# Patient Record
Sex: Female | Born: 1940 | Race: White | Hispanic: No | Marital: Married | Smoking: Never smoker
Health system: Southern US, Community
[De-identification: ages and names within clinical notes are randomized; demographics above are authoritative.]

## PROBLEM LIST (undated history)

## (undated) DIAGNOSIS — E039 Hypothyroidism, unspecified: Secondary | ICD-10-CM

## (undated) DIAGNOSIS — F419 Anxiety disorder, unspecified: Secondary | ICD-10-CM

## (undated) DIAGNOSIS — E785 Hyperlipidemia, unspecified: Secondary | ICD-10-CM

## (undated) DIAGNOSIS — G4730 Sleep apnea, unspecified: Secondary | ICD-10-CM

## (undated) DIAGNOSIS — I10 Essential (primary) hypertension: Secondary | ICD-10-CM

## (undated) DIAGNOSIS — K2970 Gastritis, unspecified, without bleeding: Secondary | ICD-10-CM

## (undated) DIAGNOSIS — H02402 Unspecified ptosis of left eyelid: Secondary | ICD-10-CM

## (undated) DIAGNOSIS — E119 Type 2 diabetes mellitus without complications: Secondary | ICD-10-CM

## (undated) DIAGNOSIS — M1990 Unspecified osteoarthritis, unspecified site: Secondary | ICD-10-CM

## (undated) DIAGNOSIS — G4733 Obstructive sleep apnea (adult) (pediatric): Secondary | ICD-10-CM

## (undated) DIAGNOSIS — E079 Disorder of thyroid, unspecified: Secondary | ICD-10-CM

## (undated) DIAGNOSIS — K219 Gastro-esophageal reflux disease without esophagitis: Secondary | ICD-10-CM

## (undated) HISTORY — PX: APPENDECTOMY: SHX54

## (undated) HISTORY — DX: Hyperlipidemia, unspecified: E78.5

## (undated) HISTORY — DX: Unspecified ptosis of left eyelid: H02.402

## (undated) HISTORY — DX: Anxiety disorder, unspecified: F41.9

## (undated) HISTORY — DX: Type 2 diabetes mellitus without complications: E11.9

## (undated) HISTORY — DX: Essential (primary) hypertension: I10

## (undated) HISTORY — DX: Hypothyroidism, unspecified: E03.9

## (undated) HISTORY — DX: Gastritis, unspecified, without bleeding: K29.70

## (undated) HISTORY — DX: Unspecified osteoarthritis, unspecified site: M19.90

## (undated) HISTORY — PX: ABDOMINAL HYSTERECTOMY: SHX81

## (undated) HISTORY — PX: CHOLECYSTECTOMY: SHX55

## (undated) HISTORY — DX: Obstructive sleep apnea (adult) (pediatric): G47.33

## (undated) HISTORY — DX: Gastro-esophageal reflux disease without esophagitis: K21.9

## (undated) HISTORY — PX: DENTAL RESTORATION/EXTRACTION WITH X-RAY: SHX5796

## (undated) HISTORY — DX: Disorder of thyroid, unspecified: E07.9

## (undated) HISTORY — PX: OTHER SURGICAL HISTORY: SHX169

## (undated) HISTORY — DX: Sleep apnea, unspecified: G47.30

## (undated) HISTORY — PX: UVULECTOMY: SHX2631

---

## 1998-06-15 ENCOUNTER — Ambulatory Visit (HOSPITAL_COMMUNITY): Admission: RE | Admit: 1998-06-15 | Discharge: 1998-06-15 | Payer: Self-pay | Admitting: Family Medicine

## 1998-06-16 ENCOUNTER — Observation Stay (HOSPITAL_COMMUNITY): Admission: RE | Admit: 1998-06-16 | Discharge: 1998-06-17 | Payer: Self-pay | Admitting: *Deleted

## 2000-12-16 ENCOUNTER — Ambulatory Visit (HOSPITAL_BASED_OUTPATIENT_CLINIC_OR_DEPARTMENT_OTHER): Admission: RE | Admit: 2000-12-16 | Discharge: 2000-12-16 | Payer: Self-pay | Admitting: Internal Medicine

## 2001-01-29 ENCOUNTER — Other Ambulatory Visit: Admission: RE | Admit: 2001-01-29 | Discharge: 2001-01-29 | Payer: Self-pay | Admitting: Internal Medicine

## 2001-01-29 ENCOUNTER — Encounter (INDEPENDENT_AMBULATORY_CARE_PROVIDER_SITE_OTHER): Payer: Self-pay | Admitting: Specialist

## 2001-06-25 ENCOUNTER — Ambulatory Visit (HOSPITAL_BASED_OUTPATIENT_CLINIC_OR_DEPARTMENT_OTHER): Admission: RE | Admit: 2001-06-25 | Discharge: 2001-06-26 | Payer: Self-pay | Admitting: Otolaryngology

## 2001-06-25 ENCOUNTER — Encounter (INDEPENDENT_AMBULATORY_CARE_PROVIDER_SITE_OTHER): Payer: Self-pay | Admitting: *Deleted

## 2002-02-24 ENCOUNTER — Encounter: Admission: RE | Admit: 2002-02-24 | Discharge: 2002-03-16 | Payer: Self-pay | Admitting: Family Medicine

## 2002-03-11 ENCOUNTER — Encounter: Payer: Self-pay | Admitting: Internal Medicine

## 2003-03-10 ENCOUNTER — Encounter: Payer: Self-pay | Admitting: Internal Medicine

## 2003-10-29 ENCOUNTER — Encounter: Admission: RE | Admit: 2003-10-29 | Discharge: 2003-10-29 | Payer: Self-pay | Admitting: Orthopedic Surgery

## 2004-05-18 ENCOUNTER — Encounter: Admission: RE | Admit: 2004-05-18 | Discharge: 2004-05-18 | Payer: Self-pay | Admitting: Family Medicine

## 2004-06-27 ENCOUNTER — Encounter: Payer: Self-pay | Admitting: Internal Medicine

## 2008-12-03 ENCOUNTER — Emergency Department (HOSPITAL_COMMUNITY): Admission: EM | Admit: 2008-12-03 | Discharge: 2008-12-03 | Payer: Self-pay | Admitting: Emergency Medicine

## 2009-11-13 ENCOUNTER — Encounter (INDEPENDENT_AMBULATORY_CARE_PROVIDER_SITE_OTHER): Payer: Self-pay | Admitting: *Deleted

## 2009-12-11 ENCOUNTER — Encounter (INDEPENDENT_AMBULATORY_CARE_PROVIDER_SITE_OTHER): Payer: Self-pay | Admitting: *Deleted

## 2009-12-18 ENCOUNTER — Ambulatory Visit: Payer: Self-pay | Admitting: Internal Medicine

## 2009-12-26 ENCOUNTER — Ambulatory Visit: Payer: Self-pay | Admitting: Internal Medicine

## 2010-06-27 ENCOUNTER — Encounter: Admission: RE | Admit: 2010-06-27 | Discharge: 2010-08-30 | Payer: Self-pay | Admitting: Family Medicine

## 2010-12-26 ENCOUNTER — Telehealth: Payer: Self-pay | Admitting: Internal Medicine

## 2010-12-26 ENCOUNTER — Encounter (INDEPENDENT_AMBULATORY_CARE_PROVIDER_SITE_OTHER): Payer: Self-pay | Admitting: *Deleted

## 2011-01-01 NOTE — Letter (Signed)
Summary: [facility] Instructions  [facility]  [address] [place], [place] [address]   Phone: [phone]  Fax: [phone]       Bethany Harrington    05-10-1941    MRN: [id number]       Procedure Day [name]:  [name]  12/26/09     Arrival Time: 9:00AM     Procedure Time:  10:00AM     Location of Procedure:                    [name] _  [facility] (4th Floor)    PREPARATION FOR COLONOSCOPY WITH MIRALAX  Starting 5 days prior to your procedure 12/21/09 do not eat nuts, seeds, popcorn, corn, beans, peas,  salads, or any raw vegetables.  Do not take any fiber supplements (e.g. Metamucil, Citrucel, and Benefiber). ____________________________________________________________________________________________________   THE DAY BEFORE YOUR PROCEDURE         DATE: 12/25/09 DAY: MONDAY  1   Drink clear liquids the entire day-NO SOLID FOOD  2   Do not drink anything colored red or purple.  Avoid juices with pulp.  No orange juice.  3   Drink at least 64 oz. (8 glasses) of fluid/clear liquids during the day to prevent dehydration and help the prep work efficiently.  CLEAR LIQUIDS INCLUDE: Water Jello Ice Popsicles Tea (sugar ok, no milk/cream) Powdered fruit flavored drinks Coffee (sugar ok, no milk/cream) Gatorade Juice: apple, white grape, white cranberry  Lemonade Clear bullion, consomm, broth Carbonated beverages (any kind) Strained chicken noodle soup Hard Candy  4   Mix the entire bottle of Miralax with 64 oz. of Gatorade/Powerade in the morning and put in the refrigerator to chill.  5   At 3:00 pm take 2 Dulcolax/Bisacodyl tablets.  6   At 4:30 pm take one Reglan/Metoclopramide tablet.  7  Starting at 5:00 pm drink one 8 oz glass of the Miralax mixture every 15-20 minutes until you have finished drinking the entire 64 oz.  You should finish drinking prep around 7:30 or 8:00 pm.  8   If you are nauseated, you may take the 2nd  Reglan/Metoclopramide tablet at 6:30 pm.        9    At 8:00 pm take 2 more DULCOLAX/Bisacodyl tablets.     THE DAY OF YOUR PROCEDURE      DATE:  12/26/09 DAY: [name]  You may drink clear liquids until 8:00AM  (2 HOURS BEFORE PROCEDURE).   MEDICATION INSTRUCTIONS  Unless otherwise instructed, you should take regular prescription medications with a small sip of water as early as possible the morning of your procedure.        OTHER INSTRUCTIONS  You will need a responsible adult at least 71 years of age to accompany you and drive you home.   This person must remain in the waiting room during your procedure.  Wear loose fitting clothing that is easily removed.  Leave jewelry and other valuables at home.  However, you may wish to bring a book to read or an iPod/MP3 player to listen to music as you wait for your procedure to start.  Remove all body piercing jewelry and leave at home.  Total time from sign-in until discharge is approximately 2-3 hours.  You should go home directly after your procedure and rest.  You can resume normal activities the day after your procedure.  The day of your procedure you should not:   Drive   Make legal  decisions   Operate machinery   Drink alcohol   Return to work  You will receive specific instructions about eating, activities and medications before you leave.   The above instructions have been reviewed and explained to me by   Wyona Almas RN  December 18, 2069 9:35 AM     I fully understand and can verbalize these instructions _____________________________ Date _______

## 2011-01-01 NOTE — Miscellaneous (Signed)
Summary: [facility] Previsit/prep  Clinical Lists Changes  Medications: Added new medication of MIRALAX   POWD (POLYETHYLENE GLYCOL 3350) As per prep  instructions. - Signed Added new medication of METOCLOPRAMIDE HCL 10 MG  TABS (METOCLOPRAMIDE HCL) As per prep instructions. - Signed Added new medication of DULCOLAX 5 MG  TBEC (BISACODYL) Day before procedure take 2 at 3pm and 2 at 8pm. - Signed Rx of MIRALAX   POWD (POLYETHYLENE GLYCOL 3350) As per prep  instructions.;  #255gm x 0;  Signed;  Entered by: Wyona Almas RN;  Authorized by: Hart Carwin MD;  Method used: Electronically to [facility] [phone] [place], [place], [place]  [address], Ph: [phone], Fax: [phone] Rx of METOCLOPRAMIDE HCL 10 MG  TABS (METOCLOPRAMIDE HCL) As per prep instructions.;  #2 x 0;  Signed;  Entered by: Wyona Almas RN;  Authorized by: Hart Carwin MD;  Method used: Electronically to [facility] [phone] [place], [place], [place]  [address], Ph: [phone], Fax: [phone] Rx of DULCOLAX 5 MG  TBEC (BISACODYL) Day before procedure take 2 at 3pm and 2 at 8pm.;  #4 x 0;  Signed;  Entered by: Wyona Almas RN;  Authorized by: Hart Carwin MD;  Method used: Electronically to [facility] [phone] [place], [place], [place]  [address], Ph: [phone], Fax: [phone] Allergies: Added new allergy or adverse reaction of SULFA Added new allergy or adverse reaction of NEOSPORIN Observations: Added new observation of NKA: F (12/18/2009 8:57)    Prescriptions: DULCOLAX 5 MG  TBEC (BISACODYL) Day before procedure take 2 at 3pm and 2 at 8pm.  #4 x 0   Entered by:   Wyona Almas RN   Authorized by:   Hart Carwin MD   Signed by:   Wyona Almas RN on 12/18/2009   Method used:   Electronically to        [facility] [address] (retail)       [phone]       [place], [place]  [address]       Ph: [phone]       Fax: [phone]   RxID:   [id number] METOCLOPRAMIDE HCL 10 MG  TABS (METOCLOPRAMIDE HCL) As per prep instructions.  #2 x 0   Entered by:   Wyona Almas RN   Authorized by:   Hart Carwin MD   Signed by:   Wyona Almas RN on 12/18/2009   Method used:   Electronically to        [facility] [address] (retail)       [phone]       [place], [place]  [address]       Ph: [phone]       Fax: [phone]   RxID:   [id number] MIRALAX   POWD (POLYETHYLENE GLYCOL 3350) As per prep  instructions.  #255gm x 0   Entered by:   Wyona Almas RN   Authorized by:   Hart Carwin MD   Signed by:   Wyona Almas RN on 12/18/2009   Method used:   Electronically to        [facility] [address] (retail)       [phone]       [place], [place]  [address]  Ph: [phone]       Fax: [phone]   RxID:   [id number]

## 2011-01-01 NOTE — Procedures (Signed)
Summary: Colonoscopy  Patient: [name] Note: All result statuses are Final unless otherwise noted.  Tests: (1) Colonoscopy (COL)   COL Colonoscopy           DONE     [facility]     [address]     [place], [place]  [address]           COLONOSCOPY PROCEDURE REPORT           PATIENT:  Bethany Harrington, Bethany Harrington  MR#:  [id number]     BIRTHDATE:  12-11-1940, 68 yrs. old  GENDER:  female           ENDOSCOPIST:  Hedwig Morton. Juanda Chance, MD     Referred by:  Laruth Bouchard, M.D.           PROCEDURE DATE:  12/26/2009     PROCEDURE:  Colonoscopy with multiple cold biopsies, Colonoscopy     [phone]     ASA CLASS:  Class I     INDICATIONS:  colon cncer mother and brother     aden. polyp [id number]           MEDICATIONS:   Versed 7 mg, Fentanyl 75 mcg           DESCRIPTION OF PROCEDURE:   After the risks benefits and     alternatives of the procedure were thoroughly explained, informed     consent was obtained.  Digital rectal exam was performed and     revealed no rectal masses.   The LB [id number] endoscope     was introduced through the anus and advanced to the cecum, which     was identified by both the appendix and ileocecal valve, without     limitations.  The quality of the prep was adequate, using MiraLax.     The instrument was then slowly withdrawn as the colon was fully     examined.     <<PROCEDUREIMAGES>>           FINDINGS:  No polyps or cancers were seen (see image1, image2,     image3, image4, and image5).   Retroflexed views in the rectum     revealed no abnormalities.    The scope was then withdrawn from     the patient and the procedure completed.           COMPLICATIONS:  None           ENDOSCOPIC IMPRESSION:     1) No polyps or cancers     2) Normal colonoscopy     RECOMMENDATIONS:     1) high fiber diet           REPEAT EXAM:  In 5 year(s) for.           ______________________________     Hedwig Morton. Juanda Chance, MD           CC:           n.  eSIGNED:   Hedwig Morton. Brodie at 12/26/2009 11:04 AM           Tami Lin, [id number]  Note: An exclamation mark (!) indicates a result that was not dispersed into the flowsheet. Document Creation Date: 12/26/2009 11:05 AM _______________________________________________________________________  (1) Order result status: Final Collection or observation date-time: 12/26/2009 10:55 Requested date-time:  Receipt date-time:  Reported date-time:  Referring Physician:   Ordering Physician: Lina Sar [phone]) Specimen Source:  Source: Launa Grill Order Number: [id number] Lab site:

## 2011-01-03 NOTE — Progress Notes (Signed)
Summary: need rx refill  Phone Note Call from Patient Call back at Home Phone [phone]   Caller: Patient Call For: Dr Juanda Chance Reason for Call: Refill Medication, Talk to Nurse Summary of Call: Patient would like refill for her Nexium until her appt date 3-7 Initial call taken by: Tawni Levy,  December 26, 2010 12:23 PM  Follow-up for Phone Call        Patient states that Dr Juanda Chance was her GI Dr before she moved to [place]. She states that she had a large supply of Nexium but has since ran out. She states that she went to an urgent care to get the prescription renewed but they would not give her a prescription becuase she has been on the prescription for so long. Patient has an appointment on 02/06/11. She states that she has been out of her medication x 1 week and has had epigastric abdominal burning as well as midsternal chest discomfort. She denies any nausea, vomiting or other symptoms. States OTC medications are not working. Dr Juanda Chance, would you like to wait to see patient in office before giving her meds or would you like me to send an prescription until she can be seen in the office? Follow-up by: Lamona Curl CMA Duncan Dull),  December 26, 2010 12:31 PM  Additional Follow-up for Phone Call Additional follow up Details #1::        OK to refill.  till she sees me. Additional Follow-up by: Hart Carwin MD,  December 26, 2010 7:29 PM    Additional Follow-up for Phone Call Additional follow up Details #2::    I have left a message for the patient to call back. Dottie Nelson-Smith CMA (AAMA)  December 27, 2010 8:16 AM   I have left a message for the patient to call back. Dottie Nelson-Smith CMA Duncan Dull)  December 28, 2010 8:26 AM   Patient called back. I have advised her that Dr Juanda Chance is okay with me giving her refills of Nexium until she can be seen in the office. Patient now states, my insurance will not cover that. I have asked that she call her insurance company to find out what  kind of PPI they prefer and to call us back so we can send it in. Patient verbalizes understanding. Follow-up by: Lamona Curl CMA Duncan Dull),  December 28, 2010 8:36 AM   Appended Document: need rx refill Patient called back. She states her insurance will cover pantoprazole. Prescription sent to [facility] per patient request. Lamona Curl CMA (AAMA)  December 28, 2010 9:30 AM   Clinical Lists Changes  Medications: Added new medication of PANTOPRAZOLE SODIUM 40 MG TBEC (PANTOPRAZOLE SODIUM) Take 1 tablet by mouth 30 minutes before breakfast daily. - Signed Rx of PANTOPRAZOLE SODIUM 40 MG TBEC (PANTOPRAZOLE SODIUM) Take 1 tablet by mouth 30 minutes before breakfast daily.;  #30 x 1;  Signed;  Entered by: Lamona Curl CMA (AAMA);  Authorized by: Hart Carwin MD;  Method used: Electronically to [facility] [phone] [place], [place], [place]  [address], Ph: [phone], Fax: [phone]    Prescriptions: PANTOPRAZOLE SODIUM 40 MG TBEC (PANTOPRAZOLE SODIUM) Take 1 tablet by mouth 30 minutes before breakfast daily.  #30 x 1   Entered by:   Lamona Curl CMA (AAMA)   Authorized by:   Hart Carwin MD   Signed by:   Lamona Curl CMA (AAMA) on 12/28/2010   Method used:   Electronically to  [facility] [address] (retail)       [phone]       [place], [place]  [address]       Ph: [phone]       Fax: [phone]   RxID:   [id number]

## 2011-01-03 NOTE — Letter (Signed)
Summary: New Patient letter  [facility]  [address] [place], [place] [address]   Phone: [phone]  Fax: [phone]       12/26/2010 MRN: [id number]  [facility] [address] [place], [place]  [address]  Dear Ms. Siems,  Welcome to the Gastroenterology Division at [facility]    You are scheduled to see Dr.  Lina Sar on February 06, 2011 at 8:45am on the 3rd floor at [facility], [address]  We ask that you try to arrive at our office 15 minutes prior to your appointment time to allow for check-in.  We would like you to complete the enclosed self-administered evaluation form prior to your visit and bring it with you on the day of your appointment.  We will review it with you.  Also, please bring a complete list of all your medications or, if you prefer, bring the medication bottles and we will list them.  Please bring your insurance card so that we may make a copy of it.  If your insurance requires a referral to see a specialist, please bring your referral form from your primary care physician.  Co-payments are due at the time of your visit and may be paid by cash, check or credit card.     Your office visit will consist of a consult with your physician (includes a physical exam), any laboratory testing he/she may order, scheduling of any necessary diagnostic testing (e.g. x-ray, ultrasound, CT-scan), and scheduling of a procedure (e.g. Endoscopy, Colonoscopy) if required.  Please allow enough time on your schedule to allow for any/all of these possibilities.    If you cannot keep your appointment, please call [phone] to cancel or reschedule prior to your appointment date.  This allows [place] the opportunity to schedule an appointment for another patient in need of care.  If you do not cancel or reschedule by 5 p.m. the business day prior to your appointment date, you will be charged a $50.00 late cancellation/no-show fee.    Thank you  for choosing [facility] for your medical needs.  We appreciate the opportunity to care for you.  Please visit [place] at our website  to learn more about our practice.                     Sincerely,                                                             The Gastroenterology Division

## 2011-01-31 DIAGNOSIS — E785 Hyperlipidemia, unspecified: Secondary | ICD-10-CM | POA: Insufficient documentation

## 2011-01-31 DIAGNOSIS — K219 Gastro-esophageal reflux disease without esophagitis: Secondary | ICD-10-CM | POA: Insufficient documentation

## 2011-01-31 DIAGNOSIS — Z860100 Personal history of colon polyps, unspecified: Secondary | ICD-10-CM | POA: Insufficient documentation

## 2011-01-31 DIAGNOSIS — G4730 Sleep apnea, unspecified: Secondary | ICD-10-CM | POA: Insufficient documentation

## 2011-01-31 DIAGNOSIS — M1990 Unspecified osteoarthritis, unspecified site: Secondary | ICD-10-CM | POA: Insufficient documentation

## 2011-02-06 ENCOUNTER — Ambulatory Visit (INDEPENDENT_AMBULATORY_CARE_PROVIDER_SITE_OTHER): Payer: Medicare Other | Admitting: Internal Medicine

## 2011-02-06 ENCOUNTER — Encounter: Payer: Self-pay | Admitting: Internal Medicine

## 2011-02-06 DIAGNOSIS — K209 Esophagitis, unspecified without bleeding: Secondary | ICD-10-CM | POA: Insufficient documentation

## 2011-02-06 DIAGNOSIS — K2970 Gastritis, unspecified, without bleeding: Secondary | ICD-10-CM | POA: Insufficient documentation

## 2011-02-06 DIAGNOSIS — E079 Disorder of thyroid, unspecified: Secondary | ICD-10-CM | POA: Insufficient documentation

## 2011-02-06 DIAGNOSIS — K219 Gastro-esophageal reflux disease without esophagitis: Secondary | ICD-10-CM

## 2011-02-06 DIAGNOSIS — K2990 Gastroduodenitis, unspecified, without bleeding: Secondary | ICD-10-CM | POA: Insufficient documentation

## 2011-02-06 DIAGNOSIS — I10 Essential (primary) hypertension: Secondary | ICD-10-CM | POA: Insufficient documentation

## 2011-02-06 DIAGNOSIS — E119 Type 2 diabetes mellitus without complications: Secondary | ICD-10-CM | POA: Insufficient documentation

## 2011-02-07 NOTE — Procedures (Signed)
Summary: COLON   Colonoscopy  Procedure date:  06/27/2004  Findings:      Location:  [facility].   Patient Name: Bethany Harrington, Bethany Harrington MRN:  Procedure Procedures: Colonoscopy CPT: [phone].    with Hot Biopsy(s)CPT: Z451292.  Personnel: Endoscopist: Monterius Rolf L. Juanda Chance, MD.  Exam Location: Exam performed in Outpatient Clinic. Outpatient  Patient Consent: Procedure, Alternatives, Risks and Benefits discussed, consent obtained, from patient. Consent was obtained by the RN.  Indications  Surveillance of: Adenomatous Polyp(s). Initial polypectomy was performed in 2002. 1-2 Polyps were found at Index Exam. Largest polyp removed was 6 to 9 mm. Prior polyp located in distal colon. Pathology of worst  polyp: tubular adenoma. The patient has not had surgery.  History  Current Medications: Patient is taking an non-steroidal medication. Patient is not currently taking Coumadin.  Pre-Exam Physical: Performed Jun 27, 2004. Cardio-pulmonary exam, Rectal exam, HEENT exam , Abdominal exam, Extremity exam, Neurological exam, Mental status exam WNL.  Exam Exam: Extent of exam reached: Cecum, extent intended: Cecum.  The cecum was identified by appendiceal orifice and IC valve. Colon retroflexion performed. Images taken. ASA Classification: I. Tolerance: good.  Monitoring: Pulse and BP monitoring, Oximetry used. Supplemental O2 given.  Colon Prep Used Miralax for colon prep. Prep results: good.  Sedation Meds: Patient assessed and found to be appropriate for moderate (conscious) sedation. Fentanyl 100 mcg. given IV. Versed 7 mg. given IV.  Findings - OTHER FINDING: soft large erythematous ileocecalvalve found in Cecum. Biopsy/Other Finding taken.  POLYP: Sigmoid Colon, Maximum size: 5 mm. sessile polyp. Distance from Anus 20 cm. Procedure:  hot biopsy, removed, retrieved, Polyp sent to pathology. ICD9: Colon Polyps: 211.3.   Assessment Abnormal examination, see findings  above.  Diagnoses: 211.3: Colon Polyps.   Comments: s/p polypectomy Events  Unplanned Interventions: No intervention was required.  Unplanned Events: There were no complications. Plans  Post Exam Instructions: No aspirin or non-steroidal containing medications: 2 weeks.  Medication Plan: Await pathology.  Patient Education: Patient given standard instructions for: Yearly hemoccult testing recommended. Patient instructed to get routine colonoscopy every 3 years.  Disposition: After procedure patient sent to recovery. After recovery patient sent home.   This report was created from the original endoscopy report, which was reviewed and signed by the above listed endoscopist.

## 2011-02-07 NOTE — Procedures (Signed)
Summary: EGD   EGD  Procedure date:  03/11/2002  Findings:      Location: [facility]   Patient Name: Bethany Harrington, Bethany Harrington MRN:  Procedure Procedures: Panendoscopy (EGD) CPT: 43235.    with biopsy(s)/brushing(s). CPT: D1846139.  Personnel: Endoscopist: Romeka Scifres L. Juanda Chance, MD.  Referred By: Eugenio Hoes Tawanna Cooler, MD.  Exam Location: Exam performed in Outpatient Clinic. Outpatient  Patient Consent: Procedure, Alternatives, Risks and Benefits discussed, consent obtained, from patient. Consent was obtained by the RN.  Indications Symptoms: Dysphagia. Chest Pain. Odynophagia. Reflux symptoms for <1 yr, occurring daily.  History  Current Medications: Patient is taking a non-steroidal medication.  Pre-Exam Physical: Performed Mar 11, 2002  Cardio-pulmonary exam, HEENT exam, Abdominal exam, Extremity exam, Neurological exam, Mental status exam WNL.  Exam Exam Info: Maximum depth of insertion Duodenum, intended Duodenum. Vocal cords visualized. Gastric retroflexion performed. Images taken. ASA Classification: I. Tolerance: good.  Sedation Meds: Patient assessed and found to be appropriate for moderate (conscious) sedation. Fentanyl 100 mcg. given IV. Versed 5 mg. given IV. Cetacaine Spray 2 sprays given IV.  Monitoring: BP and pulse monitoring done. Oximetry used. Supplemental O2 given  Findings ESOPHAGEAL INFLAMMATION: as a result of reflux. Proximal margin 38 cm from mouth,  distal margin 39 cm. Length of inflammation: 11 cm. Edema present. [place] Classification: Grade A. Biopsy/Esoph Inflamtn taken. ICD9: Esophagitis, Reflux: 530.11.  - MUCOSAL ABNORMALITY: Antrum. Erythematous mucosa.  - OTHER FINDING: bile reflux in Fundus. Biopsy/Other Finding taken. RUT done, results pending.    Assessment Abnormal examination, see findings above.  Diagnoses: 530.11: Esophagitis, Reflux.  gastritis, bile reflux. .   Comments: s/p Bx and CLO test Events  Unplanned  Intervention: No unplanned interventions were required.  Unplanned Events: There were no complications. Plans Medication(s): PPI: Pantoprazole/Protonix 40 mg QD, starting Mar 11, 2002  Ulcer Meds: Carafate 1 QID, starting Mar 11, 2002   Comments: hold Fosamax and Indocin for now Disposition: After procedure patient sent to recovery. After recovery patient sent home.    This report was created from the original endoscopy report, which was reviewed and signed by the above listed endoscopist.    RUT-NEGATIVE

## 2011-02-07 NOTE — Assessment & Plan Note (Signed)
Summary: Gastroenterology  [place]  MR#:  [id number] Page #  [facility]   GASTROENTEROLOGY OFFICE NOTE  NAME:  Korrin, Waterfield   OFFICE NO:  [id number]  DATE:  03/10/03  DOB:  2041/07/09  The patient is a very nice 70 year old white female who comes in for evaluation of recurrent epigastric burning, reflux, and regurgitation of food and liquids.  We saw her for similar problems exactly one year ago when she underwent evaluation with findings of reflux esophagitis of the acid as well as of bile reflux.  Her CLO test was negative.  She was treated with Carafate slurry and Protonix 40 mg daily with some improvement of her symptoms.  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  For the past several weeks, she has had increasing hoarseness, choking at night, and burning in the substernal area.  She denies any dysphagia.  She has increased her Protonix to 40 mg twice daily for the past three weeks without any improvement.    PHYSICAL EXAMINATION:  Blood pressure 122/64, pulse 70 and regular, and weight 139 pounds.  She was in no distress.  Oral cavity did not show any aphthous ulcers.   Neck was supple.  Lungs were clear to auscultation.  No wheezes or rales.  Her voice did not appear to be hoarse.  COR: With normal S1 and normal S2.   Abdomen was soft with tenderness across the upper abdomen, mostly in the epigastrium in the midline.  There was no CVA tenderness.  Lower abdomen was unremarkable.  Rectal exam showed Hemoccult-negative stool.    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  She seemed to think that she is unable to  discontinue her anti-inflammatory agents because of the back pain.  She also is concerned about the cost of her medications, which she takes.  I have explained to her the reason behind her abdominal and chest pain; and I suggested that she minimize her anti-inflammatory drugs, especially Advil and Indocin.  I have offered non-NSAID-related pain analgesics such as Ultram 50 mg to take temporarily for the next three to four weeks until her symptoms subside.    PLAN: 1.  Discontinue Advil and decrease Indocin to minimum.   2.  Continue Protonix 40 mg p.o. b.i.d.  3.  Add Carafate slurry 1 gram t.i.d.  4.  I gave her a prescription for Ultram 50 mg p.o. daily.  If symptoms continue, we may consider adding Reglan.  At this time, I have not scheduled for repeat upper endoscopy because she had one 12 months ago.        Hedwig Morton. Juanda Chance, M.D.  [id number] cc:  Dr. Alonza Smoker  D:  03/10/03; T:  ; Job [id number]

## 2011-02-07 NOTE — Letter (Signed)
Summary: EGD BX  EGD BX   Imported By: Lamona Curl CMA (AAMA) 01/31/2011 17:08:30  _____________________________________________________________________  External Attachment:    Type:   Image     Comment:   External Document

## 2011-02-12 NOTE — Assessment & Plan Note (Signed)
Summary: reflux.sch w pt [employer] cx fee advised mailed forms .em   History of Present Illness Visit Type: new patient  Primary GI MD: Lina Sar MD Primary Provider: [facility]([address], [place]) Requesting Provider: na Chief Complaint: Pt c/o GERD, belching, bloating, and constipation  History of Present Illness:   This is a 70 year old white female with chronic gastroesophageal reflux controlled on PPI's. She switched from Nexium to pantoprazole because of the lower co-pay. She has an occasional cough and had several episodes of dysphagia. She denies any heartburn or chest pain. There is a family history of colon cancer in her mother and her brother. The last colonoscopy in January 2011 was normal. She had a hyperplastic polyp on a colonoscopy in 2005 and a tubular adenoma in 2002. Her last upper endoscopy in 2003 showed mild reflux, esophagitis and bile reflux. She had a prior cholecystectomy.   GI Review of Systems    Reports acid reflux, belching, bloating, and  weight gain.      Denies abdominal pain, chest pain, dysphagia with liquids, dysphagia with solids, heartburn, loss of appetite, nausea, vomiting, vomiting blood, and  weight loss.      Reports constipation.     Denies anal fissure, black tarry stools, change in bowel habit, diarrhea, diverticulosis, fecal incontinence, heme positive stool, hemorrhoids, irritable bowel syndrome, jaundice, light color stool, liver problems, rectal bleeding, and  rectal pain.    Current Medications (verified): 1)  Pantoprazole Sodium 40 Mg Tbec (Pantoprazole Sodium) .... Take 1 Tablet By Mouth 30 Minutes Before Breakfast Daily. 2)  Glipizide 10 Mg Tabs (Glipizide) .... One Tablet By Mouth Two Times A Day 3)  Indomethacin 25 Mg Caps (Indomethacin) .... One Capsule By Mouth Two Times A Day 4)  Levothyroxine Sodium 125 Mcg Tabs (Levothyroxine Sodium) .... One Tablet By Mouth Once Daily 5)  Sertraline Hcl 50 Mg Tabs (Sertraline Hcl) .... One  Tablet By Mouth Once Daily 6)  Lisinopril-Hydrochlorothiazide 20-12.5 Mg Tabs (Lisinopril-Hydrochlorothiazide) .... One Tablet By Mouth Once Daily 7)  Pravastatin Sodium 20 Mg Tabs (Pravastatin Sodium) .... One Tablet By Mouth Once Daily 8)  Stool Softener 250 Mg Caps (Docusate Sodium) .... As Needed  Allergies (verified): 1)  ! Sulfa 2)  ! Neosporin  Past History:  Past Medical History: THYROID DISORDER (ICD-246.9) HYPERTENSION (ICD-401.9) DM (ICD-250.00) GASTRITIS (ICD-535.50) ESOPHAGITIS (ICD-530.10) COLONIC POLYPS, ADENOMATOUS, HX OF (ICD-V12.72) HYPERLIPIDEMIA (ICD-272.4) GERD (ICD-530.81) SLEEP APNEA (ICD-780.57) DEGENERATIVE JOINT DISEASE (ICD-715.90)      Past Surgical History: Reviewed history from 01/31/2011 and no changes required. Cholecystectomy Appendectomy Hysterectomy Dental Extraction  Family History: Family History of Colon Cancer: Mother and Brother  Family History of Diabetes: Mother, Brother, Son and other multiple family members   Social History: Retired Married Childern Patient has never smoked.  Illicit Drug Use - no Alcohol Use - no  Review of Systems       The patient complains of allergy/sinus, arthritis/joint pain, back pain, change in vision, cough, fatigue, shortness of breath, and sleeping problems.  The patient denies anemia, anxiety-new, blood in urine, breast changes/lumps, confusion, coughing up blood, depression-new, fainting, fever, headaches-new, hearing problems, heart murmur, heart rhythm changes, itching, menstrual pain, muscle pains/cramps, night sweats, nosebleeds, pregnancy symptoms, skin rash, sore throat, swelling of feet/legs, swollen lymph glands, thirst - excessive, urination - excessive, urination changes/pain, urine leakage, vision changes, and voice change.         Pertinent positive and negative review of systems were noted in the above HPI. All other  ROS was otherwise negative.   Vital Signs:  Patient profile:    70 year old female Height:      62 inches Weight:      143 pounds BMI:     26.25 BSA:     1.66 Pulse rate:   88 / minute Pulse rhythm:   regular BP sitting:   128 / 74  (left arm) Cuff size:   regular  Vitals Entered By: Ok Anis CMA (February 06, 2011 8:40 AM)   Physical Exam  General:  Well developed, well nourished, no acute distress. Mouth:  No deformity or lesions, dentition normal. Neck:  Supple; no masses or thyromegaly. Lungs:  Clear throughout to auscultation. Heart:  Regular rate and rhythm; no murmurs, rubs,  or bruits. Abdomen:  Soft, nontender and nondistended. No masses, hepatosplenomegaly or hernias noted. Normal bowel sounds. Rectal:  soft Hemoccult negative stool Extremities:  No clubbing, cyanosis, edema or deformities noted. Skin:  Intact without significant lesions or rashes. Psych:  Alert and cooperative. Normal mood and affect.   Impression & Recommendations:  Problem # 1:  ESOPHAGITIS (ICD-530.10) Patient has chronic gastroesophageal reflux disease controlled on PPI's. We will continue to refill her pantoprazole for one year. She needs to check with [place] in one year.  Problem # 2:  COLONIC POLYPS, ADENOMATOUS, HX OF (ICD-V12.72) Patient has a family history of colon cancer in two direct relatives and a personal history of adenomatous polyps. A recall colonoscopy will be due in January 2016.  Patient Instructions: 1)  continue pantoprazole 40 mg daily. 2)  Office visit one year. 3)  Antireflux measures. 4)  Recall colonoscopy January 2016. 5)  Copy sent to :[facility] [address]  6)  The medication list was reviewed and reconciled.  All changed / newly prescribed medications were explained.  A complete medication list was provided to the patient / caregiver. Prescriptions: PANTOPRAZOLE SODIUM 40 MG TBEC (PANTOPRAZOLE SODIUM) Take 1 tablet by mouth 30 minutes before breakfast daily.  #30 x 11   Entered by:   Lamona Curl CMA (AAMA)    Authorized by:   Hart Carwin MD   Signed by:   Lamona Curl CMA (AAMA) on 02/06/2011   Method used:   Electronically to        [facility] [address] (retail)       [phone]       [place], [place]  [address]       Ph: [phone]       Fax: [phone]   RxID:   [id number]

## 2011-03-18 LAB — URINALYSIS, ROUTINE W REFLEX MICROSCOPIC
Bilirubin Urine: NEGATIVE
Glucose, UA: NEGATIVE mg/dL
Nitrite: POSITIVE — AB
Protein, ur: >300 mg/dL — AB
Specific Gravity, Urine: 1.022 (ref 1.005–1.030)
Urobilinogen, UA: 1 mg/dL (ref 0.0–1.0)
pH: 6 (ref 5.0–8.0)

## 2011-03-18 LAB — URINE CULTURE: Colony Count: >=100000

## 2011-03-18 LAB — URINE MICROSCOPIC-ADD ON

## 2011-04-19 NOTE — Op Note (Signed)
Bethany Harrington  Patient:    Bethany Harrington                   MRN: [id number] Proc. Date: 06/25/01 Attending:  Margit Banda. Jearld Fenton, M.D. CC:         Charlaine Dalton. Sherene Sires, M.D. [facility]  Evette Georges, M.D. [facility]   Operative Report  PREOPERATIVE DIAGNOSES:  Obstructive sleep apnea with deviated septum and turbinate hypertrophy.  POSTOPERATIVE DIAGNOSES: Obstructive sleep apnea with deviated septum and turbinate hypertrophy.  SURGICAL PROCEDURE:  Septoplasty, submucous resection of inferior turbinates, uvulopharyngeal palatoplasty, and tonsillectomy.  ANESTHESIA:  General endotracheal tube.  ESTIMATED BLOOD LOSS:  Approximately 10 cc.  INDICATIONS:  This is a 70 year old who has had obstructive sleep apnea that has been diagnosed with sleep study.  She was tried on CPAP and failed to tolerate this and was interested in proceeding with the surgical options.  She was informed of the risks and benefits of the procedure including bleeding, infection, velopharyngeal insufficiency, change in the voice, Eagle syndrome, septal perforation, chronic crusting and drying, change in external appearance of her nose, and risk of the anesthetic.  All questions were answered and consent was obtained.  DESCRIPTION OF PROCEDURE:  The patient was taken to the operating and placed in the supine position.  After adequate general endotracheal tube anesthesia was placed in the supine position, she was prepped and draped in the usual sterile manner.  Oxymetazoline pledgets were placed into the nose bilaterally and the septum and inferior turbinates were injected with 1% lidocaine with 1:100,000 epinephrine.  A right hemitransfixion incision was performed raising a mucoperichondrial and ______ flap.  The cartilaginous portion of her nose was very firm and almost bone-like.  The cartilage was divided approximately 1 cm posterior to the caudal strut.  The caudal strut was somewhat  deviated to the right side.  The cartilage was divided posteriorly and removed with the Jansen-Middleton forceps as it was not typical cartilage. The bony portion of the septum was also removed with a Jansen-Middleton forceps after elevating the opposite flap.  There was a spur on the left side that was removed with a 4-mm osteotome.  This corrected the septal deflection. The caudal septum seemed to swing back into position after freeing it up from the remaining portion of the cartilage.  The turbinates were then infractured. A midline incision was made with a #15 blade and the mucosal flap was elevated superiorly.  The inferior mucosa and bone were removed with the turbinate scissors and the edge was suction cauterized.  The flap was laid back down over the raw surface and the turbinate was outfractured.  Hemitransfixion incision was closed with interrupted 4-0 chromic and a 4-0 plain gut quilting stitch was placed through the septum as well as the caudal strut.  A Telfa roll soaked in bacitracin was then placed into the nose bilaterally and secured with a 3-0 nylon.  The table was turned and the patient was placed in the [facility] position.  A Crowe-Davis mouth gag was inserted, retracted, and suspended from the Mayo stand.  The incision was begun using electrocautery and just above the base of the uvula and carried into the left anterior tonsillar fossa.  The capsule of the tonsil was identified and removed with electrocautery dissection.  The specimen remained en bloc and the dissection was carried to the right side where the right tonsil was removed in the same fashion.  The tonsils, uvula, and palate were  all removed in one en bloc resection.  The suction cautery was used to obtain hemostasis in the tonsillary fossae.  Interrupted 3-0 Vicryl was used to close the septum in an interrupted fashion.  The area was irrigated with saline.  The hypopharynx, esophagus, and stomach were suctioned  with the NG tube.  The Crowe-Davis was released and resuspended and there was hemostasis present in all locations. The patient was awakened, brought to recovery in stable condition.  Counts were correct. DD:  06/25/01 TD:  06/25/01 Job: [id number]

## 2012-02-29 ENCOUNTER — Other Ambulatory Visit: Payer: Self-pay | Admitting: Internal Medicine

## 2012-03-04 ENCOUNTER — Other Ambulatory Visit: Payer: Self-pay | Admitting: Internal Medicine

## 2012-10-21 ENCOUNTER — Telehealth: Payer: Self-pay | Admitting: Family Medicine

## 2012-10-21 NOTE — Telephone Encounter (Signed)
Okay to re\re except no need to set up physical examinations in January to get reestablished

## 2012-10-21 NOTE — Telephone Encounter (Signed)
Pt called and said she and her husband used to be pts of Dr Barbette Or 8 yrs ago. They both had moved away to [place], but have moved back. Both are req to re-est with Dr Tawanna Cooler. Both have Medicare and [employer]. Pls advise if ok.

## 2012-10-28 ENCOUNTER — Telehealth: Payer: Self-pay | Admitting: Family Medicine

## 2012-10-28 DIAGNOSIS — R351 Nocturia: Secondary | ICD-10-CM

## 2012-10-28 DIAGNOSIS — E785 Hyperlipidemia, unspecified: Secondary | ICD-10-CM

## 2012-10-28 DIAGNOSIS — E079 Disorder of thyroid, unspecified: Secondary | ICD-10-CM

## 2012-10-28 DIAGNOSIS — I10 Essential (primary) hypertension: Secondary | ICD-10-CM

## 2012-10-28 DIAGNOSIS — E119 Type 2 diabetes mellitus without complications: Secondary | ICD-10-CM

## 2012-10-28 NOTE — Telephone Encounter (Signed)
Called and sch pt lab appt as noted.

## 2012-10-28 NOTE — Telephone Encounter (Signed)
This pt has been sch ov to re-est with Dr Tawanna Cooler on 11/17/12 as per previous phone note. Pt is having problems with lft eye drooping. Causing problems with pts vision. Also pt is req to get a1c lvl and tsh checked. Req to get an ov for acute only, prior to re-est. Pls advise.

## 2012-10-28 NOTE — Telephone Encounter (Signed)
Called and lft vm that Dr Tawanna Cooler has agreed for pt and spouse to re-est.

## 2012-10-28 NOTE — Telephone Encounter (Signed)
Please call patient.  An opthalmologic visit will be need and please schedule lab appointment - orders placed.

## 2012-11-04 ENCOUNTER — Other Ambulatory Visit (INDEPENDENT_AMBULATORY_CARE_PROVIDER_SITE_OTHER): Payer: 59

## 2012-11-04 DIAGNOSIS — I10 Essential (primary) hypertension: Secondary | ICD-10-CM

## 2012-11-04 DIAGNOSIS — E785 Hyperlipidemia, unspecified: Secondary | ICD-10-CM

## 2012-11-04 DIAGNOSIS — E119 Type 2 diabetes mellitus without complications: Secondary | ICD-10-CM

## 2012-11-04 DIAGNOSIS — E079 Disorder of thyroid, unspecified: Secondary | ICD-10-CM

## 2012-11-04 DIAGNOSIS — R351 Nocturia: Secondary | ICD-10-CM

## 2012-11-04 LAB — BASIC METABOLIC PANEL
BUN: 27 mg/dL — ABNORMAL HIGH (ref 6–23)
CO2: 29 mEq/L (ref 19–32)
Calcium: 9.2 mg/dL (ref 8.4–10.5)
Chloride: 102 mEq/L (ref 96–112)
Creatinine, Ser: 0.9 mg/dL (ref 0.4–1.2)
GFR: 68.81 mL/min (ref >60.00)
Glucose, Bld: 126 mg/dL — ABNORMAL HIGH (ref 70–99)
Potassium: 3.8 mEq/L (ref 3.5–5.1)
Sodium: 139 mEq/L (ref 135–145)

## 2012-11-04 LAB — HEPATIC FUNCTION PANEL
ALT: 24 U/L (ref 0–35)
AST: 20 U/L (ref 0–37)
Albumin: 3.7 g/dL (ref 3.5–5.2)
Alkaline Phosphatase: 54 U/L (ref 39–117)
Bilirubin, Direct: 0 mg/dL (ref 0.0–0.3)
Total Bilirubin: 0.5 mg/dL (ref 0.3–1.2)
Total Protein: 7 g/dL (ref 6.0–8.3)

## 2012-11-04 LAB — CBC WITH DIFFERENTIAL/PLATELET
Basophils Absolute: 0 10*3/uL (ref 0.0–0.1)
Basophils Relative: 0.4 % (ref 0.0–3.0)
Eosinophils Absolute: 0.2 10*3/uL (ref 0.0–0.7)
Eosinophils Relative: 3 % (ref 0.0–5.0)
HCT: 39.1 % (ref 36.0–46.0)
Hemoglobin: 12.8 g/dL (ref 12.0–15.0)
Lymphocytes Relative: 19.6 % (ref 12.0–46.0)
Lymphs Abs: 1.6 10*3/uL (ref 0.7–4.0)
MCHC: 32.8 g/dL (ref 30.0–36.0)
MCV: 90 fl (ref 78.0–100.0)
Monocytes Absolute: 0.8 10*3/uL (ref 0.1–1.0)
Monocytes Relative: 9.7 % (ref 3.0–12.0)
Neutro Abs: 5.7 10*3/uL (ref 1.4–7.7)
Neutrophils Relative %: 67.3 % (ref 43.0–77.0)
Platelets: 243 10*3/uL (ref 150.0–400.0)
RBC: 4.34 Mil/uL (ref 3.87–5.11)
RDW: 13.1 % (ref 11.5–14.6)
WBC: 8.4 10*3/uL (ref 4.5–10.5)

## 2012-11-04 LAB — LIPID PANEL
Cholesterol: 218 mg/dL — ABNORMAL HIGH (ref 0–200)
HDL: 47.4 mg/dL (ref >39.00)
Total CHOL/HDL Ratio: 5
Triglycerides: 146 mg/dL (ref 0.0–149.0)
VLDL: 29.2 mg/dL (ref 0.0–40.0)

## 2012-11-04 LAB — TSH: TSH: 0.31 u[IU]/mL — ABNORMAL LOW (ref 0.35–5.50)

## 2012-11-04 LAB — HEMOGLOBIN A1C: Hgb A1c MFr Bld: 6.7 % — ABNORMAL HIGH (ref 4.6–6.5)

## 2012-11-04 LAB — LDL CHOLESTEROL, DIRECT: Direct LDL: 154.9 mg/dL

## 2012-11-17 ENCOUNTER — Ambulatory Visit (INDEPENDENT_AMBULATORY_CARE_PROVIDER_SITE_OTHER): Payer: 59 | Admitting: Family Medicine

## 2012-11-17 ENCOUNTER — Encounter: Payer: Self-pay | Admitting: Family Medicine

## 2012-11-17 VITALS — BP 110/80 | Temp 97.8°F | Ht 62.0 in | Wt 139.0 lb

## 2012-11-17 DIAGNOSIS — E119 Type 2 diabetes mellitus without complications: Secondary | ICD-10-CM

## 2012-11-17 DIAGNOSIS — E079 Disorder of thyroid, unspecified: Secondary | ICD-10-CM

## 2012-11-17 DIAGNOSIS — G589 Mononeuropathy, unspecified: Secondary | ICD-10-CM

## 2012-11-17 DIAGNOSIS — G629 Polyneuropathy, unspecified: Secondary | ICD-10-CM

## 2012-11-17 DIAGNOSIS — K219 Gastro-esophageal reflux disease without esophagitis: Secondary | ICD-10-CM

## 2012-11-17 DIAGNOSIS — G4730 Sleep apnea, unspecified: Secondary | ICD-10-CM

## 2012-11-17 DIAGNOSIS — Z23 Encounter for immunization: Secondary | ICD-10-CM

## 2012-11-17 DIAGNOSIS — Z0000 Encounter for general adult medical examination without abnormal findings: Secondary | ICD-10-CM

## 2012-11-17 DIAGNOSIS — M1990 Unspecified osteoarthritis, unspecified site: Secondary | ICD-10-CM

## 2012-11-17 DIAGNOSIS — E785 Hyperlipidemia, unspecified: Secondary | ICD-10-CM

## 2012-11-17 DIAGNOSIS — I10 Essential (primary) hypertension: Secondary | ICD-10-CM

## 2012-11-17 DIAGNOSIS — F329 Major depressive disorder, single episode, unspecified: Secondary | ICD-10-CM

## 2012-11-17 LAB — POCT URINALYSIS DIPSTICK
Bilirubin, UA: NEGATIVE
Ketones, UA: NEGATIVE
Protein, UA: NEGATIVE
Spec Grav, UA: 1.02
pH, UA: 6

## 2012-11-17 MED ORDER — PANTOPRAZOLE SODIUM 40 MG PO TBEC: 40.0000 mg | DELAYED_RELEASE_TABLET | Freq: Every day | ORAL | Status: DC

## 2012-11-17 MED ORDER — LOSARTAN POTASSIUM-HCTZ 50-12.5 MG PO TABS: 1.0000 | ORAL_TABLET | Freq: Every day | ORAL | Status: DC

## 2012-11-17 MED ORDER — SERTRALINE HCL 50 MG PO TABS: 50.0000 mg | ORAL_TABLET | Freq: Every day | ORAL | Status: DC

## 2012-11-17 MED ORDER — LEVOTHYROXINE SODIUM 125 MCG PO TABS: 125.0000 ug | ORAL_TABLET | Freq: Every day | ORAL | Status: DC

## 2012-11-17 MED ORDER — AMITRIPTYLINE HCL 25 MG PO TABS: 25.0000 mg | ORAL_TABLET | Freq: Every day | ORAL | Status: DC

## 2012-11-17 MED ORDER — GLIPIZIDE 10 MG PO TABS: 10.0000 mg | ORAL_TABLET | Freq: Two times a day (BID) | ORAL | Status: DC

## 2012-11-17 NOTE — Patient Instructions (Signed)
Continue medications except stop the gabapentin, Mobic, and Zetia  You may take 200 mg of Motrin twice daily for joint pain  Start the Elavil 25 mg one tablet daily at bedtime for the neuropathy  Cancel that neurology appointment with Dr. Anne Hahn  Make an appointment to see Dr. Mia Creek ophthalmologists for evaluation of your eyes.  Return in one month for followup  We will call you about your lab work and if we have to make any changes we will do it by phone  Do a thorough breast exam monthly and call today and get setup for mammogram.

## 2012-11-17 NOTE — Progress Notes (Signed)
Subjective:    Patient ID: Bethany Harrington, female    DOB: 25-Feb-1941, 71 y.o.   MRN: [id number]  HPI Bethany Harrington is a 71 year old married female nonsmoker who comes in to reestablish after a six-year absence she moved to [place] with her husband.  She's been back in [place] there are and saw Dr. Kevan Ny in [facility]. She also saw a neurologist in [place] because of diabetic neuropathy. She states she's not going to another neurologist because of lid lag.  She has diabetes on 10 mg of glipizide twice a day and states her last A1c was 5.8% but she has no records of any her lab work. She takes gabapentin 100 mg 3 times daily for neuropathy, Synthroid 125 mcg daily for hypothyroidism and was started and 50-12.5 daily for hypertension. She's been taking Mobic twice a day advised to stop because of the underlying interaction between anti-inflammatories and her medication. She also takes both Plavix reflux esophagitis over-the-counter eyedrops for dry eyes and Zoloft 50 mg daily at bedtime for depression. She's been on Zetia but it biases stop it because the data shows that has no bag you.  She gets routine eye care, dental care, not checking her breast monthly, mammogram a couple years ago and [place], vaccinations she does not recall, she thinks she had a colonoscopy in her 38s which was normal  Cognitive function normal she walks on a regular basis for now safety reviewed no issues identified, no guns in the house, she does have a health care power of attorney and living well   Review of Systems  Constitutional: Negative.   HENT: Negative.   Eyes: Negative.   Respiratory: Negative.   Cardiovascular: Negative.   Gastrointestinal: Negative.   Genitourinary: Negative.   Musculoskeletal: Negative.   Neurological: Negative.   Hematological: Negative.   Psychiatric/Behavioral: Negative.        Objective:   Physical Exam  Constitutional: She appears well-developed and well-nourished.  HENT:   Head: Normocephalic and atraumatic.  Right Ear: External ear normal.  Left Ear: External ear normal.  Nose: Nose normal.  Mouth/Throat: Oropharynx is clear and moist.  Eyes: EOM are normal. Pupils are equal, round, and reactive to light.  Neck: Normal range of motion. Neck supple. No thyromegaly present.  Cardiovascular: Normal rate, regular rhythm, normal heart sounds and intact distal pulses.  Exam reveals no gallop and no friction rub.   No murmur heard. Pulmonary/Chest: Effort normal and breath sounds normal.  Abdominal: Soft. Bowel sounds are normal. She exhibits no distension and no mass. There is no tenderness. There is no rebound.  Genitourinary:       Bilateral breast exam normal BSE was taught advised to do BSE monthly at home on her birth date monthly also and you mammography she was given the number of the breast Center to call today  Musculoskeletal: Normal range of motion.  Lymphadenopathy:    She has no cervical adenopathy.  Neurological: She is alert. She has normal reflexes. No cranial nerve deficit. She exhibits normal muscle tone. Coordination normal.  Skin: Skin is warm and dry.  Psychiatric: She has a normal mood and affect. Her behavior is normal. Judgment and thought content normal.          Assessment & Plan:     Diabetes type 2 on glipizide 10 mg twice a day check labs  Neuropathy stop gabapentin Elavil 25 mg hypothyroidism continue levothyroxine 125 mcg daily  Hypertension continue Hyzaar 50-12.5 daily  Reflux esophagitis continue  protonic 40 mg daily  History of depression continue Zoloft 50 mg daily  Advised to stop the Mobic in the Zetia followup in 3 months  Lid lag referred to Dr. Vonna Kotyk each bedtime

## 2012-11-18 ENCOUNTER — Telehealth: Payer: Self-pay | Admitting: Family Medicine

## 2012-11-18 NOTE — Telephone Encounter (Signed)
Fleet Contras,,,,,,,,,, I wrote down the main of the ophthalmologists Dr. Vonna Kotyk. I would also recommend she see the neurologist Dr. Anne Hahn

## 2012-11-18 NOTE — Telephone Encounter (Signed)
Spoke with pt again regarding a prior auth. She asked if I'd heard back from Dr. Tawanna Cooler about her questions on the eye dr. I see his note. However, she didn't need the name. She wants to know if there was a specific reason he wanted her to see an eye doc, or a specific test to be run. If it is just for an eye exam, she had one last week and bought new glasses and contacts. She said her insurance won't cover another exam now. Please advise and call pt. Thank you.

## 2012-11-18 NOTE — Telephone Encounter (Signed)
error 

## 2012-11-18 NOTE — Telephone Encounter (Signed)
Pt was here yesterday. Dr. Tawanna Cooler stated he wanted her to get her eyes checked. She did that last week, so she wasn't sure if she needed to do something different, or if she was ok. Please advise.

## 2012-11-19 NOTE — Telephone Encounter (Signed)
Bethany Harrington please call and talk with her husband we don't seem to be communicating with her. She needs to go ahead and see the ophthalmologist Dr. Vonna Kotyk and the neurologist either here or Dr. Anne Hahn or the neurologist she is previously seen in [place].

## 2012-11-27 ENCOUNTER — Other Ambulatory Visit: Payer: Self-pay | Admitting: Family Medicine

## 2012-11-27 DIAGNOSIS — G629 Polyneuropathy, unspecified: Secondary | ICD-10-CM

## 2012-11-30 NOTE — Telephone Encounter (Signed)
Spoke with patient.

## 2012-12-01 DIAGNOSIS — H532 Diplopia: Secondary | ICD-10-CM | POA: Insufficient documentation

## 2013-01-23 ENCOUNTER — Encounter: Payer: Self-pay | Admitting: Neurology

## 2013-01-23 DIAGNOSIS — H532 Diplopia: Secondary | ICD-10-CM

## 2013-01-29 ENCOUNTER — Other Ambulatory Visit: Payer: Self-pay | Admitting: Family Medicine

## 2013-01-29 ENCOUNTER — Telehealth: Payer: Self-pay | Admitting: Family Medicine

## 2013-01-29 NOTE — Telephone Encounter (Signed)
Patient tests once daily. She uses True Track test strips. She was buying 50 strips at a time. She would like a rx for these, so she doesn't have to pay $52 per 100 strips. [facility] uses [facility] [address]. I notified pt that if her insurance does not cover True Track, she will have to switch to the strip they DO cover, and get a new meter. Please call her w/any questions.

## 2013-01-29 NOTE — Telephone Encounter (Signed)
Per pharmacy- written Rx ready for pick up.  Patient is aware.  rx mailed to home address

## 2013-02-11 ENCOUNTER — Telehealth: Payer: Self-pay | Admitting: Family Medicine

## 2013-02-11 MED ORDER — ONETOUCH ULTRASOFT LANCETS MISC: Status: AC

## 2013-02-11 NOTE — Telephone Encounter (Signed)
Spoke with patient and she will come to the office for a new glucometer.  [facility].

## 2013-02-11 NOTE — Telephone Encounter (Signed)
Pt requests a prescription for a new METER so it can be covered by insurance. Her insurance covers AccuChek or One Touch meters. Pt also lancets sent in [facility] [address]. Do not add the strips to the rx - pharm will adjust the one they already have.  Please MAIL the rx for the meter. Lancet rx can be [facility] electronically?

## 2013-03-09 ENCOUNTER — Encounter: Payer: Self-pay | Admitting: Neurology

## 2013-03-09 ENCOUNTER — Ambulatory Visit (INDEPENDENT_AMBULATORY_CARE_PROVIDER_SITE_OTHER): Payer: Medicare HMO | Admitting: Neurology

## 2013-03-09 VITALS — BP 119/72 | HR 95 | Ht 63.25 in | Wt 140.0 lb

## 2013-03-09 DIAGNOSIS — H02409 Unspecified ptosis of unspecified eyelid: Secondary | ICD-10-CM

## 2013-03-09 DIAGNOSIS — H02402 Unspecified ptosis of left eyelid: Secondary | ICD-10-CM

## 2013-03-09 DIAGNOSIS — H532 Diplopia: Secondary | ICD-10-CM

## 2013-03-09 HISTORY — DX: Unspecified ptosis of left eyelid: H02.402

## 2013-03-09 NOTE — Progress Notes (Signed)
Reason for visit: Ptosis  Bethany Harrington is an 72 y.o. female  History of present illness:  Bethany Harrington is a 72 year old right-handed white female with a history of ptosis that has been present since December of 2013. The patient indicates that over time, the problem has improved, not worsening. The patient no longer has issues with ptosis or double vision during the day, but she may note some problems in the evening when she is tired. The patient denies any weakness of the extremities. The patient has undergone a workup that has included MRI evaluation of the brain, MRA of the head, and carotid Doppler studies. These studies were unremarkable with exception that she did have some small vessel disease that is minimal in nature. The patient had blood work that did not show evidence of abnormalities, and the acetylcholine receptor antibody was negative. The patient returns for an evaluation.  Past Medical History  Diagnosis Date  . Thyroid disease   . Hypertension   . Diabetes mellitus without complication   . Gastritis   . GERD (gastroesophageal reflux disease)   . Hyperlipidemia   . Sleep apnea   . DJD (degenerative joint disease)   . Dyslipidemia   . Anxiety   . Hypothyroidism   . Obstructive sleep apnea   . Ptosis of left eyelid 03/09/2013    Past Surgical History  Procedure Laterality Date  . Cholecystectomy    . Appendectomy    . Abdominal hysterectomy    . Carpal tunnel      left  . Dental restoration/extraction with x-ray    . Uvulectomy N/A     Family History  Problem Relation Age of Onset  . Cancer Mother     colon  . Diabetes Mother   . Thyroid disease Mother   . Other Mother     Dyslipidemia  . Arthritis Mother   . Cancer Brother     colon  . Diabetes Brother   . Diabetes Son   . Diabetes Other   . Other Father     Committed suicide age unknown    Social history:  reports that she has never smoked. She has never used smokeless tobacco. She  reports that she does not drink alcohol or use illicit drugs.  Allergies:  Allergies  Allergen Reactions  . Neomycin-Bacitracin Zn-Polymyx     REACTION: rash  . Sulfonamide Derivatives     REACTION: rash    Medications:  Current Outpatient Prescriptions on File Prior to Visit  Medication Sig Dispense Refill  . Ascorbic Acid (VITAMIN C) 1000 MG tablet Take 1,000 mg by mouth daily.      Marland Kitchen aspirin 81 MG tablet Take 81 mg by mouth daily.      . fish oil-omega-3 fatty acids 1000 MG capsule Take 1 g by mouth daily.      Marland Kitchen glipiZIDE (GLUCOTROL) 10 MG tablet Take 1 tablet (10 mg total) by mouth 2 (two) times daily before a meal.  200 tablet  3  . Lancets (ONETOUCH ULTRASOFT) lancets Use once daily for glucose control  100 each  12  . losartan-hydrochlorothiazide (HYZAAR) 50-12.5 MG per tablet TAKE 1 TABLET BY MOUTH EVERY DAY  90 tablet  3  . pantoprazole (PROTONIX) 40 MG tablet Take 1 tablet (40 mg total) by mouth daily.  100 tablet  3  . sertraline (ZOLOFT) 50 MG tablet Take 1 tablet (50 mg total) by mouth daily.  100 tablet  3  . Specialty Vitamins Products (  MAGNESIUM, AMINO ACID CHELATE,) 133 MG tablet Take 1 tablet by mouth 1 day or 1 dose.      . vitamin B-12 (CYANOCOBALAMIN) 1000 MCG tablet Take 1,000 mcg by mouth daily.      . vitamin E (VITAMIN E) 400 UNIT capsule Take 400 Units by mouth daily.      . prednisoLONE acetate (PRED FORTE) 1 % ophthalmic suspension Apply 1 drop to eye.       Marland Kitchen ZETIA 10 MG tablet Take 10 mg by mouth daily.        No current facility-administered medications on file prior to visit.    ROS:  Out of a complete 14 system review of symptoms, the patient complains only of the following symptoms, and all other reviewed systems are negative.  Ringing in the ears Constipation  Blood pressure 119/72, pulse 95, height 5' 3.25" (1.607 m), weight 140 lb (63.504 kg).  Physical Exam  General: The patient is alert and cooperative at the time of the  examination.  Skin: No significant peripheral edema is noted.   Neurologic Exam  Cranial nerves: Facial symmetry is present. Speech is normal, no aphasia or dysarthria is noted. Extraocular movements are full. Visual fields are full. With superior gaze for 1 minute, there is a 1 or 2 mm increase in ptosis of the left eye. There is divergence of gaze, with exotropia of the left eye. The patient subjectively notes double vision after 30 seconds of superior gaze.  Motor: The patient has good strength in all 4 extremities.  Coordination: The patient has good finger-nose-finger and heel-to-shin bilaterally.  Gait and station: The patient has a normal gait. Tandem gait is normal. Romberg is negative. No drift is seen.  Reflexes: Deep tendon reflexes are symmetric.   Assessment/Plan:  1. 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 Clinical examination today does show some divergence of gaze, with exotropia of the left eye with superior gaze for 1 minute, and some mild increase in ptosis. The patient at this point believes that she clinically is improving. We will watch this issue for now, and the patient will followup in 6 months. If she worsens with her symptoms, she will be placed on Mestinon prior to her next visit.  Marlan Palau MD 03/09/2013 8:32 PM  [facility] [address] [place], [place] [address]  Phone [phone] Fax [phone]

## 2013-03-24 ENCOUNTER — Other Ambulatory Visit: Payer: Self-pay | Admitting: *Deleted

## 2013-03-27 ENCOUNTER — Other Ambulatory Visit: Payer: Self-pay | Admitting: Family Medicine

## 2013-05-23 ENCOUNTER — Emergency Department (HOSPITAL_COMMUNITY)
Admission: EM | Admit: 2013-05-23 | Discharge: 2013-05-24 | Disposition: A | Discharge status: Home / Self Care | Payer: Medicare HMO | Attending: Emergency Medicine | Admitting: Emergency Medicine

## 2013-05-23 ENCOUNTER — Emergency Department (HOSPITAL_COMMUNITY): Payer: Medicare HMO

## 2013-05-23 ENCOUNTER — Encounter (HOSPITAL_COMMUNITY): Payer: Self-pay | Admitting: Emergency Medicine

## 2013-05-23 DIAGNOSIS — Z8669 Personal history of other diseases of the nervous system and sense organs: Secondary | ICD-10-CM | POA: Insufficient documentation

## 2013-05-23 DIAGNOSIS — Z8719 Personal history of other diseases of the digestive system: Secondary | ICD-10-CM | POA: Insufficient documentation

## 2013-05-23 DIAGNOSIS — I10 Essential (primary) hypertension: Secondary | ICD-10-CM | POA: Insufficient documentation

## 2013-05-23 DIAGNOSIS — K219 Gastro-esophageal reflux disease without esophagitis: Secondary | ICD-10-CM | POA: Insufficient documentation

## 2013-05-23 DIAGNOSIS — Z7982 Long term (current) use of aspirin: Secondary | ICD-10-CM | POA: Insufficient documentation

## 2013-05-23 DIAGNOSIS — G4733 Obstructive sleep apnea (adult) (pediatric): Secondary | ICD-10-CM | POA: Insufficient documentation

## 2013-05-23 DIAGNOSIS — F411 Generalized anxiety disorder: Secondary | ICD-10-CM | POA: Insufficient documentation

## 2013-05-23 DIAGNOSIS — Z79899 Other long term (current) drug therapy: Secondary | ICD-10-CM | POA: Insufficient documentation

## 2013-05-23 DIAGNOSIS — E785 Hyperlipidemia, unspecified: Secondary | ICD-10-CM | POA: Insufficient documentation

## 2013-05-23 DIAGNOSIS — Z882 Allergy status to sulfonamides status: Secondary | ICD-10-CM | POA: Insufficient documentation

## 2013-05-23 DIAGNOSIS — E039 Hypothyroidism, unspecified: Secondary | ICD-10-CM | POA: Insufficient documentation

## 2013-05-23 DIAGNOSIS — IMO0002 Reserved for concepts with insufficient information to code with codable children: Secondary | ICD-10-CM | POA: Insufficient documentation

## 2013-05-23 DIAGNOSIS — E079 Disorder of thyroid, unspecified: Secondary | ICD-10-CM | POA: Insufficient documentation

## 2013-05-23 DIAGNOSIS — E119 Type 2 diabetes mellitus without complications: Secondary | ICD-10-CM | POA: Insufficient documentation

## 2013-05-23 DIAGNOSIS — M1990 Unspecified osteoarthritis, unspecified site: Secondary | ICD-10-CM | POA: Insufficient documentation

## 2013-05-23 DIAGNOSIS — R079 Chest pain, unspecified: Secondary | ICD-10-CM | POA: Insufficient documentation

## 2013-05-23 DIAGNOSIS — R110 Nausea: Secondary | ICD-10-CM | POA: Insufficient documentation

## 2013-05-23 LAB — CBC
HCT: 37 % (ref 36.0–46.0)
Hemoglobin: 12.8 g/dL (ref 12.0–15.0)
MCHC: 34.6 g/dL (ref 30.0–36.0)
MCV: 85.5 fL (ref 78.0–100.0)

## 2013-05-23 LAB — PROTIME-INR: Prothrombin Time: 11.9 seconds (ref 11.6–15.2)

## 2013-05-23 NOTE — ED Provider Notes (Signed)
History     CSN: [id number]  Arrival date & time 05/23/13  2246   First MD Initiated Contact with Patient 05/23/13 2259      Chief Complaint  Patient presents with  . Chest Pain    (Consider location/radiation/quality/duration/timing/severity/associated sxs/prior treatment) HPI Patient presents after an episode of chest pain, now resolved. Approximately 2 hours prior to my evaluation she was sitting, when she had the sudden onset of burning pain under the left breast with radiation to the left scapula.  There was associated nausea, but no vomiting, no lightheadedness, no syncope, no unilateral weakness or dysesthesia. Symptoms lasted moments.  Resolved spontaneously.  The patient was provided aspirin by EMS providers. Patient states that she was in her usual state of health prior to the onset of symptoms. On my exam she denies any ongoing complaints.  Past Medical History  Diagnosis Date  . Thyroid disease   . Hypertension   . Diabetes mellitus without complication   . Gastritis   . GERD (gastroesophageal reflux disease)   . Hyperlipidemia   . Sleep apnea   . DJD (degenerative joint disease)   . Dyslipidemia   . Anxiety   . Hypothyroidism   . Obstructive sleep apnea   . Ptosis of left eyelid 03/09/2013    Past Surgical History  Procedure Laterality Date  . Cholecystectomy    . Appendectomy    . Abdominal hysterectomy    . Carpal tunnel      left  . Dental restoration/extraction with x-ray    . Uvulectomy N/A     Family History  Problem Relation Age of Onset  . Cancer Mother     colon  . Diabetes Mother   . Thyroid disease Mother   . Other Mother     Dyslipidemia  . Arthritis Mother   . Cancer Brother     colon  . Diabetes Brother   . Diabetes Son   . Diabetes Other   . Other Father     Committed suicide age unknown    History  Substance Use Topics  . Smoking status: Never Smoker   . Smokeless tobacco: Never Used  . Alcohol Use: No    OB History    Grav Para Term Preterm Abortions TAB SAB Ect Mult Living                  Review of Systems  Constitutional:       Per HPI, otherwise negative  HENT:       Per HPI, otherwise negative  Respiratory:       Per HPI, otherwise negative  Cardiovascular:       Per HPI, otherwise negative  Gastrointestinal: Positive for nausea. Negative for vomiting.  Endocrine:       Negative aside from HPI  Genitourinary:       Neg aside from HPI   Musculoskeletal:       Per HPI, otherwise negative  Skin: Negative.   Neurological: Negative for syncope.    Allergies  Neomycin-bacitracin zn-polymyx and Sulfonamide derivatives  Home Medications   Current Outpatient Rx  Name  Route  Sig  Dispense  Refill  . amitriptyline (ELAVIL) 25 MG tablet               . Ascorbic Acid (VITAMIN C) 1000 MG tablet   Oral   Take 1,000 mg by mouth daily.         Marland Kitchen aspirin 81 MG tablet   Oral  Take 81 mg by mouth daily.         . fish oil-omega-3 fatty acids 1000 MG capsule   Oral   Take 1 g by mouth daily.         Marland Kitchen glipiZIDE (GLUCOTROL) 10 MG tablet   Oral   Take 1 tablet (10 mg total) by mouth 2 (two) times daily before a meal.   200 tablet   3   . Lancets (ONETOUCH ULTRASOFT) lancets      Use once daily for glucose control   100 each   12     Dx 250.00   . levothyroxine (SYNTHROID, LEVOTHROID) 125 MCG tablet   Oral   Take 125 mcg by mouth daily. 1/2 tablet daily         . losartan-hydrochlorothiazide (HYZAAR) 50-12.5 MG per tablet      TAKE 1 TABLET BY MOUTH EVERY DAY   90 tablet   3   . ONE TOUCH ULTRA TEST test strip               . pantoprazole (PROTONIX) 40 MG tablet      TAKE 1 TABLET (40 MG TOTAL) BY MOUTH DAILY.   90 tablet   0   . prednisoLONE acetate (PRED FORTE) 1 % ophthalmic suspension   Ophthalmic   Apply 1 drop to eye.          . sertraline (ZOLOFT) 50 MG tablet   Oral   Take 1 tablet (50 mg total) by mouth daily.   100 tablet   3   .  Specialty Vitamins Products (MAGNESIUM, AMINO ACID CHELATE,) 133 MG tablet   Oral   Take 1 tablet by mouth 1 day or 1 dose.         . vitamin B-12 (CYANOCOBALAMIN) 1000 MCG tablet   Oral   Take 1,000 mcg by mouth daily.         . vitamin E (VITAMIN E) 400 UNIT capsule   Oral   Take 400 Units by mouth daily.         Marland Kitchen ZETIA 10 MG tablet   Oral   Take 10 mg by mouth daily.            BP 176/77  Pulse 67  Temp(Src) 98 F (36.7 C) (Oral)  Resp 12  SpO2 98%  Physical Exam  Nursing note and vitals reviewed. Constitutional: She is oriented to person, place, and time. She appears well-developed and well-nourished. No distress.  HENT:  Head: Normocephalic and atraumatic.  Eyes: Conjunctivae and EOM are normal.  Cardiovascular: Normal rate and regular rhythm.   Pulmonary/Chest: Effort normal and breath sounds normal. No stridor. No respiratory distress.  Abdominal: She exhibits no distension.  Musculoskeletal: She exhibits no edema.  Neurological: She is alert and oriented to person, place, and time. No cranial nerve deficit.  Skin: Skin is warm and dry.  Psychiatric: She has a normal mood and affect.    ED Course  Procedures (including critical care time)  Labs Reviewed  CBC  COMPREHENSIVE METABOLIC PANEL  PROTIME-INR   No results found.   No diagnosis found.  O2- 100%ra, normal  Cardiac: 71sr, nml   Date: 05/23/2013  Rate: 66  Rhythm: normal sinus rhythm  QRS Axis: left  Intervals: normal  ST/T Wave abnormalities: normal  Conduction Disutrbances:artefact  Narrative Interpretation:   Old EKG Reviewed: none available Borderline ECG - no overt ST changes  12:44 AM On repeat exam the patient  continues to have no ongoing complaints. We had a lengthy discussion of the causes of chest pain.  The patient states that she would like to go home.  Given the absence of pain in the absence of risk factors for PE or ACS this seems reasonable. MDM  This patient  presents after moments of chest pain.  On exam the patient has resolved, and she is awake and alert, in no distress with stable vital signs.  The patient has one prior stress test in the past that was unremarkable, has no risk factors for ACS, PE.  Vital signs are stable, labs unremarkable.  We discussed the utility of serial troponins to further decrease the probability that this is an episode of unstable angina, though this seems unlikely from her description of symptoms, the absence of distress.  The patient however, would like to go home, and states that you follow up with her primary care physician in the morning for additional evaluation and management.        Gerhard Munch, MD 05/24/13 [phone]

## 2013-05-23 NOTE — ED Notes (Signed)
Per ems-- pt from home with cp onset 25 mins prior to contacting ems- pain under left breast radiating to left shoulder blade. Pt also reports abdominal discomfort. Pt quit taking bp medications 1 month ago. cbg 105. 20 G IV to left hand. Pt currently pain free. Pain  Relieved on own. 12 lead unremarkble.

## 2013-05-24 LAB — COMPREHENSIVE METABOLIC PANEL
Alkaline Phosphatase: 68 U/L (ref 39–117)
BUN: 21 mg/dL (ref 6–23)
GFR calc Af Amer: >90 mL/min (ref >90)
Glucose, Bld: 113 mg/dL — ABNORMAL HIGH (ref 70–99)
Potassium: 3.9 mEq/L (ref 3.5–5.1)
Total Bilirubin: 0.2 mg/dL — ABNORMAL LOW (ref 0.3–1.2)
Total Protein: 7 g/dL (ref 6.0–8.3)

## 2013-05-27 ENCOUNTER — Ambulatory Visit: Payer: 59 | Admitting: Family Medicine

## 2013-06-01 ENCOUNTER — Encounter: Payer: Self-pay | Admitting: Family Medicine

## 2013-06-01 ENCOUNTER — Ambulatory Visit (INDEPENDENT_AMBULATORY_CARE_PROVIDER_SITE_OTHER): Payer: 59 | Admitting: Family Medicine

## 2013-06-01 VITALS — BP 116/80 | HR 72 | Temp 98.2°F | Resp 16 | Ht 63.25 in | Wt 144.0 lb

## 2013-06-01 DIAGNOSIS — R079 Chest pain, unspecified: Secondary | ICD-10-CM | POA: Insufficient documentation

## 2013-06-01 NOTE — Patient Instructions (Signed)
Continue your current medications  We will set you up a consult with Dr. Marca Ancona for further cardiac evaluation  In the meantime if you have any other further chest pain come directly back to the emergency room

## 2013-06-01 NOTE — Progress Notes (Signed)
  Subjective:    Patient ID: Bethany Harrington, female    DOB: October 20, 1941, 72 y.o.   MRN: [id number]  HPI Bethany Harrington is a 72 year old married female nonsmoker who comes in for followup having been seen in the emergency room on June 22 for evaluation of chest pain  She states she was home sitting in a chair and developed a gradual onset of left-sided chest pain. She points to her left breast as a source of the pain. She describes it as sharp like a knife it went to her right breast and seem to go to her left scapula. Intensity was a 9 on a scale of 1-10. She states she feels short of breath no diaphoresis. It lasted about 30 minutes. She became concerned and called EMS. By the time they got there he had it pretty much eased off. She was taken emergently from cardiac studies including EKG and enzymes were all normal. Her EKG was read as an old lateral infarct however it doesn't appear any different than her cardiogram we did a couple years ago which was read as normal. Indeed I had that also reviewed by Dr. Kirtland Bouchard. And he does not feel that the EKG is abnormal.  She has risk factors including diabetes on Glucotrol 10 mg twice a day last A1c this past winter was 6.7. Her diabetes is always been under good control. She was on Hyzaar 50/12.5 for hypertension she stopped that because she was having side effects. BP today 116/80  Her cholesterol is always been fairly normal her LDL has been in the 145 range.  Family history no immediate family members with coronary disease.  She does not exercise on a regular basis.  She states there is a lot of stress in her life.  Her last mammogram was over a year ago   Review of Systems Review of systems otherwise negative    Objective:   Physical Exam Well-developed well-nourished female no acute distress cardiopulmonary exam normal bilateral breast exam normal EKG reviewed and again Dr. Joyce Gross and I both feel that normal         Assessment & Plan:  Atypical  chest pain sharp left breast type,,,,,,,,,,, it sounds like chest wall pain however because of her age and diabetes I would recommend we get a cardiac evaluation to be certain of that fact

## 2013-06-18 ENCOUNTER — Other Ambulatory Visit: Payer: Self-pay

## 2013-06-18 MED ORDER — GLIPIZIDE 10 MG PO TABS: 10.0000 mg | ORAL_TABLET | Freq: Two times a day (BID) | ORAL | Status: DC

## 2013-06-18 NOTE — Telephone Encounter (Signed)
Rx request for glipizide 10 mg bid sent to pharmacy.

## 2013-06-23 ENCOUNTER — Encounter: Payer: Self-pay | Admitting: Cardiovascular Disease

## 2013-06-23 ENCOUNTER — Ambulatory Visit (INDEPENDENT_AMBULATORY_CARE_PROVIDER_SITE_OTHER): Payer: 59 | Admitting: Cardiovascular Disease

## 2013-06-23 VITALS — BP 124/76 | HR 81 | Resp 16 | Ht 62.0 in | Wt 142.6 lb

## 2013-06-23 DIAGNOSIS — E119 Type 2 diabetes mellitus without complications: Secondary | ICD-10-CM

## 2013-06-23 DIAGNOSIS — R079 Chest pain, unspecified: Secondary | ICD-10-CM

## 2013-06-23 DIAGNOSIS — E785 Hyperlipidemia, unspecified: Secondary | ICD-10-CM

## 2013-06-23 DIAGNOSIS — I10 Essential (primary) hypertension: Secondary | ICD-10-CM

## 2013-06-23 NOTE — Patient Instructions (Addendum)
Your physician has requested that you have a lexiscan myoview. For further information please visit [email or web address] Please follow instruction sheet, as given. You will be notified about the results of your stress test within 2-3 business days. As long as your stress test is normal you will not need to return for follow up. If your results are abnormal then you will be notified and a follow up office visit will be need to discuss further options.

## 2013-06-26 ENCOUNTER — Encounter: Payer: Self-pay | Admitting: Cardiovascular Disease

## 2013-06-26 NOTE — Assessment & Plan Note (Signed)
Controlled, hemoglobin A1c in target range

## 2013-06-26 NOTE — Assessment & Plan Note (Signed)
Controlled, no evidence of kidney disease.

## 2013-06-26 NOTE — Progress Notes (Signed)
Patient ID: Bethany Harrington, female   DOB: 12/05/1940, 72 y.o.   MRN: [id number]     Reason for office visit Chest pain  This is her first encounter with her cardiologist for chest pain. She has numerous coronary risk factors but with the exception of her elevated cholesterol these are well treated. She has compensated hypertension and diabetes mellitus with a glycosylated hemoglobin in the target range. She does not smoke. Her family history significant for diabetes in her mother and one of her brothers but there is no clear pattern of early coronary or vascular problems.  She was sitting in a chair quietly when she began experiencing percent no chest discomfort radiating from her left breast towards her right breast but also posteriorly to the area of her scapula. This became very severe and she felt herself immobilized in a chair unable to even call for help. The pain remained severe for about 30 minutes but did not associate shortness of breath, dizziness, presyncope, focal neurological deficits or other problems. It has not happened since. Coloration the emergency room shows a normal electrocardiogram and normal cardiac enzymes.  Her past medical history list gastritis and esophagitis and GERD. She does not have a history of stroke, TIA, symptoms of peripheral arterial disease. She has been evaluated by Dr. Anne Hahn for Diflucan and ptosis of the eyelid with a negative carotid Dopplers and MRI MRA of the head.  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  Allergies  Allergen Reactions  . Neomycin-Bacitracin Zn-Polymyx     REACTION: rash  . Sulfonamide Derivatives     REACTION: rash    Current Outpatient Prescriptions  Medication Sig Dispense Refill  . Ascorbic Acid (VITAMIN C) 1000 MG tablet Take 1,000 mg by mouth daily.      Marland Kitchen aspirin 81 MG tablet Take 81 mg by mouth daily.      Marland Kitchen CINNAMON PO Take 4 tablets by mouth daily.      . fish oil-omega-3 fatty acids 1000 MG  capsule Take 1 g by mouth daily.      Marland Kitchen glipiZIDE (GLUCOTROL) 10 MG tablet Take 1 tablet (10 mg total) by mouth 2 (two) times daily before a meal.  180 tablet  3  . ibuprofen (ADVIL,MOTRIN) 200 MG tablet Take 200 mg by mouth 2 (two) times daily.      . Lancets (ONETOUCH ULTRASOFT) lancets Use once daily for glucose control  100 each  12  . levothyroxine (SYNTHROID, LEVOTHROID) 125 MCG tablet Take 62.5 mcg by mouth daily. 1/2 tablet daily      . ONE TOUCH ULTRA TEST test strip       . pantoprazole (PROTONIX) 40 MG tablet Take 40 mg by mouth daily.      . Red Yeast Rice Extract (RED YEAST RICE PO) Take 4 tablets by mouth daily.      . sertraline (ZOLOFT) 50 MG tablet Take 25-50 mg by mouth See admin instructions. Alternates every other day.      Marland Kitchen Specialty Vitamins Products (MAGNESIUM, AMINO ACID CHELATE,) 133 MG tablet Take 1 tablet by mouth 1 day or 1 dose.      . vitamin B-12 (CYANOCOBALAMIN) 1000 MCG tablet Take 1,000 mcg by mouth daily.      . vitamin E (VITAMIN E) 400 UNIT capsule Take 400 Units by mouth daily.       No current facility-administered medications for this visit.    Past Medical History  Diagnosis Date  . Thyroid disease   .  Hypertension   . Diabetes mellitus without complication   . Gastritis   . GERD (gastroesophageal reflux disease)   . Hyperlipidemia   . Sleep apnea   . DJD (degenerative joint disease)   . Dyslipidemia   . Anxiety   . Hypothyroidism   . Obstructive sleep apnea   . Ptosis of left eyelid 03/09/2013    Past Surgical History  Procedure Laterality Date  . Cholecystectomy    . Appendectomy    . Abdominal hysterectomy    . Carpal tunnel      left  . Dental restoration/extraction with x-ray    . Uvulectomy N/A     Family History  Problem Relation Age of Onset  . Cancer Mother     colon  . Diabetes Mother   . Thyroid disease Mother   . Other Mother     Dyslipidemia  . Arthritis Mother   . Cancer Brother     colon  . Diabetes Brother    . Diabetes Son   . Diabetes Other   . Other Father     Committed suicide age unknown    History   Social History  . Marital Status: Married    Spouse Name: N/A    Number of Children: N/A  . Years of Education: N/A   Occupational History  . Not on file.   Social History Main Topics  . Smoking status: Never Smoker   . Smokeless tobacco: Never Used  . Alcohol Use: No  . Drug Use: No  . Sexually Active: Not on file   Other Topics Concern  . Not on file   Social History Narrative  . No narrative on file    Review of systems: The patient specifically denies  dyspnea at rest or with exertion, orthopnea, paroxysmal nocturnal dyspnea, syncope, palpitations, other focal neurological deficits, intermittent claudication, lower extremity edema, unexplained weight gain, cough, hemoptysis or wheezing.  The patient also denies abdominal pain, nausea, vomiting, dysphagia, diarrhea, constipation, polyuria, polydipsia, dysuria, hematuria, frequency, urgency, abnormal bleeding or bruising, fever, chills, unexpected weight changes, mood swings, change in skin or hair texture, change in voice quality, auditory or visual problems, allergic reactions or rashes, new musculoskeletal complaints other than usual "aches and pains".   PHYSICAL EXAM BP 124/76  Pulse 81  Resp 16  Ht 5\' 2"  (1.575 m)  Wt 142 lb 9.6 oz (64.683 kg)  BMI 26.08 kg/m2  General: Alert, oriented x3, no distress Head: no evidence of trauma, PERRL, EOMI, no exophtalmos or lid lag, no myxedema, no xanthelasma; normal ears, nose and oropharynx Neck: normal jugular venous pulsations and no hepatojugular reflux; brisk carotid pulses without delay and no carotid bruits Chest: clear to auscultation, no signs of consolidation by percussion or palpation, normal fremitus, symmetrical and full respiratory excursions Cardiovascular: normal position and quality of the apical impulse, regular rhythm, normal first and second heart sounds,  no murmurs, rubs or gallops Abdomen: no tenderness or distention, no masses by palpation, no abnormal pulsatility or arterial bruits, normal bowel sounds, no hepatosplenomegaly Extremities: no clubbing, cyanosis or edema; 2+ radial, ulnar and brachial pulses bilaterally; 2+ right femoral, posterior tibial and dorsalis pedis pulses; 2+ left femoral, posterior tibial and dorsalis pedis pulses; no subclavian or femoral bruits Neurological: grossly nonfocal   EKG: NSR, normal  Lipid Panel     Component Value Date/Time   CHOL 218* 11/04/2012 0954   TRIG 146.0 11/04/2012 0954   HDL 47.40 11/04/2012 0954   CHOLHDL 5 11/04/2012  0954   VLDL 29.2 11/04/2012 0954    BMET    Component Value Date/Time   NA 137 05/23/2013 2330   K 3.9 05/23/2013 2330   CL 101 05/23/2013 2330   CO2 26 05/23/2013 2330   GLUCOSE 113* 05/23/2013 2330   BUN 21 05/23/2013 2330   CREATININE 0.79 05/23/2013 2330   CALCIUM 9.5 05/23/2013 2330   GFRNONAA 81* 05/23/2013 2330   GFRAA >90 05/23/2013 2330     ASSESSMENT AND PLAN Chest pain, unspecified Although the symptoms are atypical, her coronary risk factor profile is fairly prominent and she should undergo a stress myocardial perfusion study. She is uncertain of her ability to exercise on the treadmill. She will probably need to have a [employer]. Since this was a one-time event and she now feels well, if the study is normal I would not pursue further cardiac testing. There is evidence of ischemic perfusion abnormality, she should undergo coronary angiography since her symptoms occurred at rest suggesting a possible unstable coronary syndrome.  DM Controlled, hemoglobin A1c in target range  HYPERLIPIDEMIA Target LDL cholesterol should be 161 mg/dL or less because of the presence of diabetes mellitus  HYPERTENSION Controlled, no evidence of kidney disease.  Orders Placed This Encounter  Procedures  . Myocardial Perfusion Imaging  . EKG 12-Lead   No orders of the  defined types were placed in this encounter.    Junious Silk, MD, [facility] [phone] office [phone] pager

## 2013-06-26 NOTE — Assessment & Plan Note (Addendum)
Although the symptoms are atypical, her coronary risk factor profile is fairly prominent and she should undergo a stress myocardial perfusion study. She is uncertain of her ability to exercise on the treadmill. She will probably need to have a [employer]. Since this was a one-time event and she now feels well, if the study is normal I would not pursue further cardiac testing. There is evidence of ischemic perfusion abnormality, she should undergo coronary angiography since her symptoms occurred at rest suggesting a possible unstable coronary syndrome.

## 2013-06-26 NOTE — Assessment & Plan Note (Signed)
Target LDL cholesterol should be 098 mg/dL or less because of the presence of diabetes mellitus

## 2013-07-01 ENCOUNTER — Ambulatory Visit (HOSPITAL_COMMUNITY)
Admission: RE | Admit: 2013-07-01 | Discharge: 2013-07-01 | Disposition: A | Discharge status: Home / Self Care | Payer: Medicare HMO | Source: Ambulatory Visit | Attending: Cardiovascular Disease | Admitting: Cardiovascular Disease

## 2013-07-01 DIAGNOSIS — R9431 Abnormal electrocardiogram [ECG] [EKG]: Secondary | ICD-10-CM | POA: Insufficient documentation

## 2013-07-01 DIAGNOSIS — E119 Type 2 diabetes mellitus without complications: Secondary | ICD-10-CM | POA: Insufficient documentation

## 2013-07-01 DIAGNOSIS — I10 Essential (primary) hypertension: Secondary | ICD-10-CM | POA: Insufficient documentation

## 2013-07-01 DIAGNOSIS — R42 Dizziness and giddiness: Secondary | ICD-10-CM | POA: Insufficient documentation

## 2013-07-01 DIAGNOSIS — R5383 Other fatigue: Secondary | ICD-10-CM | POA: Insufficient documentation

## 2013-07-01 DIAGNOSIS — R0989 Other specified symptoms and signs involving the circulatory and respiratory systems: Secondary | ICD-10-CM | POA: Insufficient documentation

## 2013-07-01 DIAGNOSIS — R079 Chest pain, unspecified: Secondary | ICD-10-CM | POA: Insufficient documentation

## 2013-07-01 DIAGNOSIS — R0609 Other forms of dyspnea: Secondary | ICD-10-CM | POA: Insufficient documentation

## 2013-07-01 DIAGNOSIS — R5381 Other malaise: Secondary | ICD-10-CM | POA: Insufficient documentation

## 2013-07-01 MED ORDER — TECHNETIUM TC 99M SESTAMIBI GENERIC - CARDIOLITE
30.0000 | Freq: Once | INTRAVENOUS | Status: AC | PRN
  Administered 2013-07-01: 30 via INTRAVENOUS

## 2013-07-01 MED ORDER — REGADENOSON 0.4 MG/5ML IV SOLN
0.4000 mg | Freq: Once | INTRAVENOUS | Status: AC
  Administered 2013-07-01: 0.4 mg via INTRAVENOUS

## 2013-07-01 MED ORDER — TECHNETIUM TC 99M SESTAMIBI GENERIC - CARDIOLITE
10.0000 | Freq: Once | INTRAVENOUS | Status: AC | PRN
  Administered 2013-07-01: 10 via INTRAVENOUS

## 2013-07-01 NOTE — Procedures (Addendum)
[facility] [address] [place] [address] [phone]  Cardiology Nuclear Med Study  Bethany Harrington is a 71 y.o. female     MRN : [id number]     DOB: [birth year]  Procedure Date: 07/01/2013  Nuclear Med Background Indication for Stress Test:  Post Hospital and Abnormal EKG History:  NO PRIOR HISTORY REPORTED Cardiac Risk Factors: Hypertension, Lipids and NIDDM  Symptoms:  Chest Pain, Dizziness, DOE, Fatigue and Light-Headedness   Nuclear Pre-Procedure Caffeine/Decaff Intake:  8:00pm NPO After: 6:00am   IV Site: R Forearm  IV 0.9% NS with Angio Cath:  22g  Chest Size (in):  N/A IV Started by: Emmit Pomfret, RN  Height: 5\' 2"  (1.575 m)  Cup Size: C  BMI:  Body mass index is 25.97 kg/(m^2). Weight:  142 lb (64.411 kg)   Tech Comments:  N/A    Nuclear Med Study 1 or 2 day study: 1 day  Stress Test Type:  Lexiscan  Order Authorizing Provider:  Bryan Lemma , MD   Resting Radionuclide: Technetium 58m Sestamibi  Resting Radionuclide Dose: 10.0 mCi   Stress Radionuclide:  Technetium 64m Sestamibi  Stress Radionuclide Dose: 30.1 mCi           Stress Protocol Rest HR: 67 Stress HR: 83  Rest BP: 153/85 Stress BP: 148/89  Exercise Time (min): n/a METS: n/a   Predicted Max HR: 147 bpm % Max HR: 68.03 bpm Rate Pressure Product: 64403  Dose of Adenosine (mg):  n/a Dose of Lexiscan: 0.4 mg  Dose of Atropine (mg): n/a Dose of Dobutamine: n/a mcg/kg/min (at max HR)  Stress Test Technologist: Esperanza Sheets, CCT Nuclear Technologist: Koren Shiver, CNMT   Rest Procedure:  Myocardial perfusion imaging was performed at rest 45 minutes following the intravenous administration of Technetium 93m Sestamibi. Stress Procedure:  The patient received IV Lexiscan 0.4 mg over 15-seconds.  Technetium 15m Sestamibi injected at 30-seconds.  There were no significant changes with Lexiscan.  Quantitative spect images were obtained  after a 45 minute delay.  Transient Ischemic Dilatation (Normal <1.22):  1.02 Lung/Heart Ratio (Normal <0.45):  0.25 QGS EDV:  36 ml QGS ESV:  10 ml LV Ejection Fraction: 73%  Signed by .      Rest ECG: NSR - Normal EKG  Stress ECG: No significant change from baseline ECG  QPS Raw Data Images:  Normal; no motion artifact; normal heart/lung ratio. Stress Images:  Normal homogeneous uptake in all areas of the myocardium. Rest Images:  Normal homogeneous uptake in all areas of the myocardium. Subtraction (SDS):  No evidence of ischemia.  Impression Exercise Capacity:  Lexiscan with no exercise. BP Response:  Normal blood pressure response. Clinical Symptoms:  No significant symptoms noted. ECG Impression:  No significant ST segment change suggestive of ischemia. Comparison with Prior Nuclear Study: No images to compare  Overall Impression:  Normal stress nuclear study.  LV Wall Motion:  NL LV Function; NL Wall Motion   Runell Gess, MD  07/01/2013 6:33 PM

## 2013-07-02 ENCOUNTER — Encounter: Payer: Self-pay | Admitting: Cardiovascular Disease

## 2013-07-05 ENCOUNTER — Ambulatory Visit: Payer: 59 | Admitting: Family Medicine

## 2013-07-05 ENCOUNTER — Encounter: Payer: Self-pay | Admitting: Family

## 2013-07-05 ENCOUNTER — Ambulatory Visit (INDEPENDENT_AMBULATORY_CARE_PROVIDER_SITE_OTHER): Payer: 59 | Admitting: Family

## 2013-07-05 VITALS — BP 130/80 | HR 76 | Wt 146.0 lb

## 2013-07-05 DIAGNOSIS — D489 Neoplasm of uncertain behavior, unspecified: Secondary | ICD-10-CM

## 2013-07-05 NOTE — Progress Notes (Signed)
Subjective:    Patient ID: Bethany Harrington, female    DOB: [birth year], 72 y.o.   MRN: [id number]  HPI 72 year old white female, nonsmoker, is in today with c/o a lesion on her scalp to the crown of her head x several months that is growing and changing. No bleeding.    Review of Systems  Constitutional: Negative.   Respiratory: Negative.   Cardiovascular: Negative.   Musculoskeletal: Negative.   Skin: Negative.        Skin lesion to the crown of the head, growing.   Allergic/Immunologic: Negative.   Psychiatric/Behavioral: Negative.    Past Medical History  Diagnosis Date  . Thyroid disease   . Hypertension   . Diabetes mellitus without complication   . Gastritis   . GERD (gastroesophageal reflux disease)   . Hyperlipidemia   . Sleep apnea   . DJD (degenerative joint disease)   . Dyslipidemia   . Anxiety   . Hypothyroidism   . Obstructive sleep apnea   . Ptosis of left eyelid 03/09/2013    History   Social History  . Marital Status: Married    Spouse Name: N/A    Number of Children: N/A  . Years of Education: N/A   Occupational History  . Not on file.   Social History Main Topics  . Smoking status: Never Smoker   . Smokeless tobacco: Never Used  . Alcohol Use: No  . Drug Use: No  . Sexually Active: Not on file   Other Topics Concern  . Not on file   Social History Narrative  . No narrative on file    Past Surgical History  Procedure Laterality Date  . Cholecystectomy    . Appendectomy    . Abdominal hysterectomy    . Carpal tunnel      left  . Dental restoration/extraction with x-ray    . Uvulectomy N/A     Family History  Problem Relation Age of Onset  . Cancer Mother     colon  . Diabetes Mother   . Thyroid disease Mother   . Other Mother     Dyslipidemia  . Arthritis Mother   . Cancer Brother     colon  . Diabetes Brother   . Diabetes Son   . Diabetes Other   . Other Father     Committed suicide age unknown    Allergies   Allergen Reactions  . Neomycin-Bacitracin Zn-Polymyx     REACTION: rash  . Sulfonamide Derivatives     REACTION: rash  . Bacitracin Rash    Current Outpatient Prescriptions on File Prior to Visit  Medication Sig Dispense Refill  . Ascorbic Acid (VITAMIN C) 1000 MG tablet Take 1,000 mg by mouth daily.      Marland Kitchen aspirin 81 MG tablet Take 81 mg by mouth daily.      Marland Kitchen CINNAMON PO Take 4 tablets by mouth daily.      . fish oil-omega-3 fatty acids 1000 MG capsule Take 1 g by mouth daily.      Marland Kitchen glipiZIDE (GLUCOTROL) 10 MG tablet Take 1 tablet (10 mg total) by mouth 2 (two) times daily before a meal.  180 tablet  3  . ibuprofen (ADVIL,MOTRIN) 200 MG tablet Take 200 mg by mouth 2 (two) times daily.      . Lancets (ONETOUCH ULTRASOFT) lancets Use once daily for glucose control  100 each  12  . levothyroxine (SYNTHROID, LEVOTHROID) 125 MCG tablet Take 62.5 mcg  by mouth daily. 1/2 tablet daily      . ONE TOUCH ULTRA TEST test strip       . pantoprazole (PROTONIX) 40 MG tablet Take 40 mg by mouth daily.      . Red Yeast Rice Extract (RED YEAST RICE PO) Take 4 tablets by mouth daily.      . sertraline (ZOLOFT) 50 MG tablet Take 25-50 mg by mouth See admin instructions. Alternates every other day.      Marland Kitchen Specialty Vitamins Products (MAGNESIUM, AMINO ACID CHELATE,) 133 MG tablet Take 1 tablet by mouth 1 day or 1 dose.      . vitamin B-12 (CYANOCOBALAMIN) 1000 MCG tablet Take 1,000 mcg by mouth daily.      . vitamin E (VITAMIN E) 400 UNIT capsule Take 400 Units by mouth daily.       No current facility-administered medications on file prior to visit.    BP 130/80  Pulse 76  Wt 146 lb (66.225 kg)  BMI 26.7 kg/m2chart    Objective:   Physical Exam  Constitutional: She is oriented to person, place, and time. She appears well-developed and well-nourished.  Cardiovascular: Normal rate, regular rhythm and normal heart sounds.   Pulmonary/Chest: Effort normal and breath sounds normal.  Neurological:  She is alert and oriented to person, place, and time.  Skin: Skin is warm and dry.  1cm irregular, rough appearing, multi-colored lesion noted to the crown of the head. Nontender.   Psychiatric: She has a normal mood and affect.     Informed consent was given by the patient for a shave biopsy. The site was prepped with Betadine and using a 15 blade a 1 cm shave biopsy was obtained. The specimin was placed in preservative and sent for pathology. Hemostasis was achieved with a compression. Wound care was discussed with the patient. The patient was informed that it would be one to 2 weeks before the pathology will be interpreted.     Assessment & Plan:  Assessment: 1. Neoplasm uncertain behavior  Plan: Pathology sent. Will notify patient pending results.

## 2013-07-07 ENCOUNTER — Other Ambulatory Visit: Payer: Self-pay | Admitting: Family

## 2013-07-07 DIAGNOSIS — C4492 Squamous cell carcinoma of skin, unspecified: Secondary | ICD-10-CM

## 2013-07-14 ENCOUNTER — Encounter: Payer: Self-pay | Admitting: Family Medicine

## 2013-07-15 ENCOUNTER — Other Ambulatory Visit: Payer: Self-pay | Admitting: Family Medicine

## 2013-08-05 ENCOUNTER — Institutional Professional Consult (permissible substitution): Payer: 59 | Admitting: Cardiology

## 2013-08-12 ENCOUNTER — Encounter: Payer: Self-pay | Admitting: Family

## 2013-08-12 ENCOUNTER — Ambulatory Visit (INDEPENDENT_AMBULATORY_CARE_PROVIDER_SITE_OTHER): Payer: Medicare HMO | Admitting: Family

## 2013-08-12 VITALS — BP 124/68 | HR 76 | Wt 145.0 lb

## 2013-08-12 DIAGNOSIS — E119 Type 2 diabetes mellitus without complications: Secondary | ICD-10-CM

## 2013-08-12 DIAGNOSIS — I10 Essential (primary) hypertension: Secondary | ICD-10-CM

## 2013-08-12 DIAGNOSIS — K219 Gastro-esophageal reflux disease without esophagitis: Secondary | ICD-10-CM

## 2013-08-12 DIAGNOSIS — E039 Hypothyroidism, unspecified: Secondary | ICD-10-CM

## 2013-08-12 LAB — BASIC METABOLIC PANEL
BUN: 19 mg/dL (ref 6–23)
Creatinine, Ser: 0.9 mg/dL (ref 0.4–1.2)
GFR: 69.59 mL/min (ref >60.00)

## 2013-08-12 LAB — CBC WITH DIFFERENTIAL/PLATELET
Basophils Absolute: 0 10*3/uL (ref 0.0–0.1)
Eosinophils Absolute: 0.3 10*3/uL (ref 0.0–0.7)
HCT: 39.5 % (ref 36.0–46.0)
Lymphs Abs: 1.8 10*3/uL (ref 0.7–4.0)
MCV: 87.7 fl (ref 78.0–100.0)
Monocytes Absolute: 0.8 10*3/uL (ref 0.1–1.0)
Platelets: 243 10*3/uL (ref 150.0–400.0)
RDW: 14 % (ref 11.5–14.6)

## 2013-08-12 LAB — HEPATIC FUNCTION PANEL
ALT: 24 U/L (ref 0–35)
Total Bilirubin: 0.5 mg/dL (ref 0.3–1.2)

## 2013-08-12 LAB — TSH: TSH: 4.14 u[IU]/mL (ref 0.35–5.50)

## 2013-08-12 NOTE — Progress Notes (Signed)
Subjective:    Patient ID: Bethany Harrington, female    DOB: 1941-01-18, 72 y.o.   MRN: [id number]  HPI  72 year old female, patient of Dr. Tawanna Cooler, is in for recheck of type 2 diabetes, hypertension, hyperlipidemia, and GERD. She reports doing well area denies any concern. She will be relocating to [place] soon. Does not routinely check her blood sugars. Denies any polyuria or, polydipsia.  Review of Systems  Constitutional: Negative.   HENT: Negative.   Respiratory: Negative.   Cardiovascular: Negative.   Gastrointestinal: Negative.   Endocrine: Negative.   Musculoskeletal: Negative.   Skin: Negative.   Allergic/Immunologic: Negative.   Neurological: Negative.   Hematological: Negative.   Psychiatric/Behavioral: Negative.    Past Medical History  Diagnosis Date  . Thyroid disease   . Hypertension   . Diabetes mellitus without complication   . Gastritis   . GERD (gastroesophageal reflux disease)   . Hyperlipidemia   . Sleep apnea   . DJD (degenerative joint disease)   . Dyslipidemia   . Anxiety   . Hypothyroidism   . Obstructive sleep apnea   . Ptosis of left eyelid 03/09/2013    History   Social History  . Marital Status: Married    Spouse Name: N/A    Number of Children: N/A  . Years of Education: N/A   Occupational History  . Not on file.   Social History Main Topics  . Smoking status: Never Smoker   . Smokeless tobacco: Never Used  . Alcohol Use: No  . Drug Use: No  . Sexual Activity: Not on file   Other Topics Concern  . Not on file   Social History Narrative  . No narrative on file    Past Surgical History  Procedure Laterality Date  . Cholecystectomy    . Appendectomy    . Abdominal hysterectomy    . Carpal tunnel      left  . Dental restoration/extraction with x-ray    . Uvulectomy N/A     Family History  Problem Relation Age of Onset  . Cancer Mother     colon  . Diabetes Mother   . Thyroid disease Mother   . Other Mother      Dyslipidemia  . Arthritis Mother   . Cancer Brother     colon  . Diabetes Brother   . Diabetes Son   . Diabetes Other   . Other Father     Committed suicide age unknown    Allergies  Allergen Reactions  . Neomycin-Bacitracin Zn-Polymyx     REACTION: rash  . Sulfonamide Derivatives     REACTION: rash  . Bacitracin Rash    Current Outpatient Prescriptions on File Prior to Visit  Medication Sig Dispense Refill  . Ascorbic Acid (VITAMIN C) 1000 MG tablet Take 1,000 mg by mouth daily.      Marland Kitchen aspirin 81 MG tablet Take 81 mg by mouth daily.      Marland Kitchen CINNAMON PO Take 4 tablets by mouth daily.      . fish oil-omega-3 fatty acids 1000 MG capsule Take 1 g by mouth daily.      Marland Kitchen glipiZIDE (GLUCOTROL) 10 MG tablet Take 1 tablet (10 mg total) by mouth 2 (two) times daily before a meal.  180 tablet  3  . ibuprofen (ADVIL,MOTRIN) 200 MG tablet Take 200 mg by mouth 2 (two) times daily.      . Lancets (ONETOUCH ULTRASOFT) lancets Use once daily for glucose control  100 each  12  . levothyroxine (SYNTHROID, LEVOTHROID) 125 MCG tablet Take 62.5 mcg by mouth daily. 1/2 tablet daily      . ONE TOUCH ULTRA TEST test strip       . pantoprazole (PROTONIX) 40 MG tablet Take 40 mg by mouth daily.      . pantoprazole (PROTONIX) 40 MG tablet TAKE 1 TABLET BY MOUTH EVERY DAY  90 tablet  0  . Red Yeast Rice Extract (RED YEAST RICE PO) Take 4 tablets by mouth daily.      . sertraline (ZOLOFT) 50 MG tablet Take 25-50 mg by mouth See admin instructions. Alternates every other day.      Marland Kitchen Specialty Vitamins Products (MAGNESIUM, AMINO ACID CHELATE,) 133 MG tablet Take 1 tablet by mouth 1 day or 1 dose.      . vitamin B-12 (CYANOCOBALAMIN) 1000 MCG tablet Take 1,000 mcg by mouth daily.      . vitamin E (VITAMIN E) 400 UNIT capsule Take 400 Units by mouth daily.       No current facility-administered medications on file prior to visit.    BP 124/68  Pulse 76  Wt 145 lb (65.772 kg)  BMI 26.51 kg/m2chart      Objective:   Physical Exam  Constitutional: She is oriented to person, place, and time. She appears well-developed and well-nourished.  HENT:  Right Ear: External ear normal.  Left Ear: External ear normal.  Nose: Nose normal.  Mouth/Throat: Oropharynx is clear and moist.  Neck: Normal range of motion. Neck supple.  Cardiovascular: Normal rate, regular rhythm and normal heart sounds.   Pulmonary/Chest: Effort normal and breath sounds normal.  Abdominal: Soft. Bowel sounds are normal.  Musculoskeletal: Normal range of motion.  Neurological: She is alert and oriented to person, place, and time.  Skin: Skin is warm and dry.  Psychiatric: She has a normal mood and affect.          Assessment & Plan:  Assessment: 1. Hypertension 2. Hyperlipidemia 3. GERD 4. Type 2 diabetes  Plan: Labs to include BMP, LFTs, CBC, A1c, TSH notify patient of results. Encouraged healthy diet, exercise, monthly self breast exams. Followup with PCP in [place] within the next 3 months.

## 2013-09-08 ENCOUNTER — Ambulatory Visit: Payer: Medicare HMO | Admitting: Neurology

## 2013-12-08 ENCOUNTER — Other Ambulatory Visit: Payer: Self-pay | Admitting: Family Medicine

## 2014-01-13 ENCOUNTER — Other Ambulatory Visit: Payer: Self-pay | Admitting: Family Medicine

## 2014-01-20 ENCOUNTER — Encounter: Payer: Self-pay | Admitting: Internal Medicine

## 2014-07-13 ENCOUNTER — Telehealth: Payer: Self-pay | Admitting: *Deleted

## 2014-07-13 DIAGNOSIS — E119 Type 2 diabetes mellitus without complications: Secondary | ICD-10-CM

## 2014-07-13 NOTE — Telephone Encounter (Signed)
Left message on machine for patient to schedule an appointment to follow up DM Lipid, a1c, bmet, micro albumin  Diabetic bundle

## 2015-01-11 ENCOUNTER — Encounter: Payer: Self-pay | Admitting: *Deleted

## 2015-01-17 ENCOUNTER — Encounter: Payer: Self-pay | Admitting: Internal Medicine

## 2015-01-31 IMAGING — CR DG CHEST 2V
2 series · 2 of 2 positions shown · non-contrast
Comparison: None.

CLINICAL DATA: Chest pain

CHEST - 2 VIEW

[w chest pa]
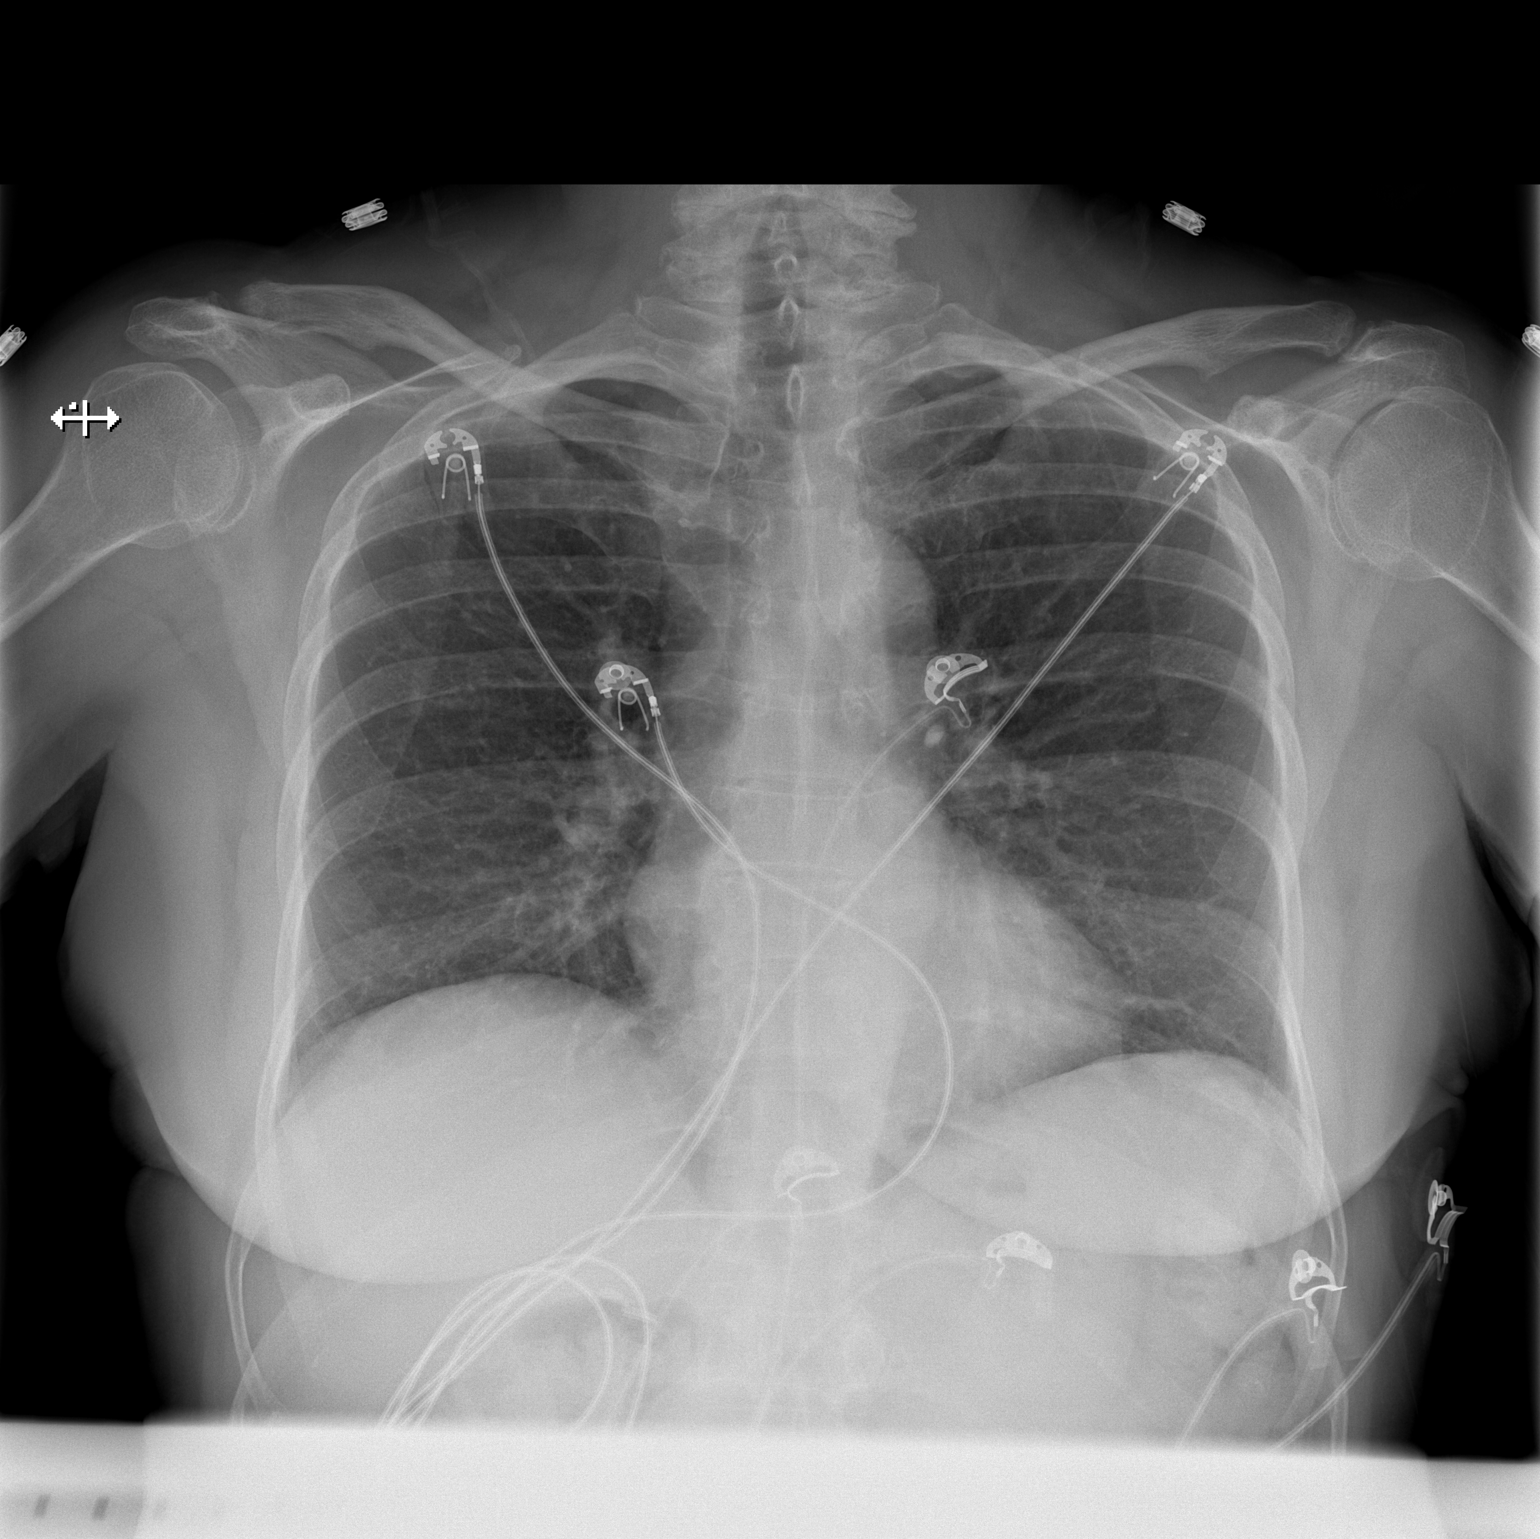

[w chest lat]
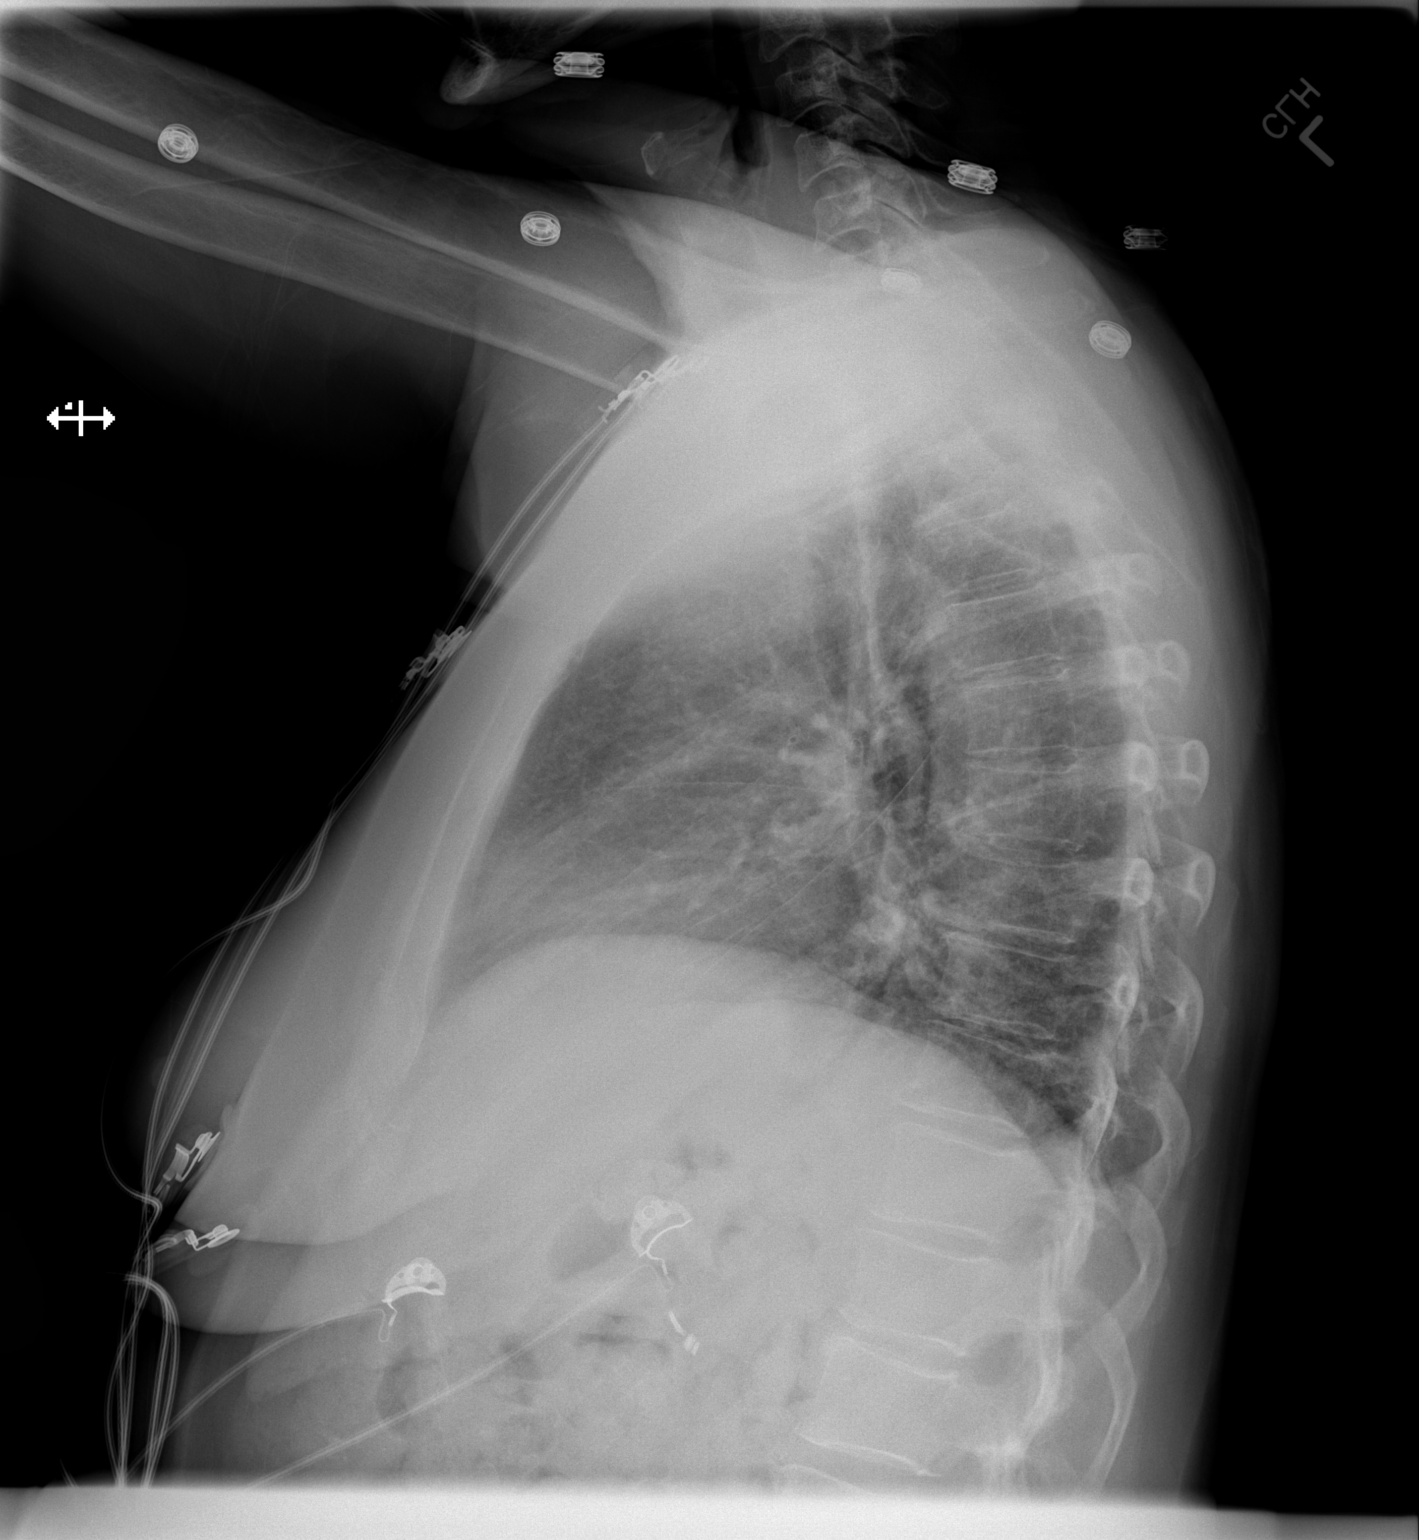

[2 of 2 positions shown; findings below may reference images not displayed]

FINDINGS: Cardiomediastinal silhouette appears normal.  No acute
pulmonary disease is noted.  Bony thorax is intact.
IMPRESSION: No acute cardiopulmonary abnormality seen.

## 2015-10-10 ENCOUNTER — Encounter: Payer: Self-pay | Admitting: Internal Medicine
